# Patient Record
Sex: Female | Born: 1945
Health system: Southern US, Community
[De-identification: ages and names within clinical notes are randomized; demographics above are authoritative.]

## PROBLEM LIST (undated history)

## (undated) DIAGNOSIS — T7840XA Allergy, unspecified, initial encounter: Secondary | ICD-10-CM

## (undated) DIAGNOSIS — M545 Low back pain, unspecified: Secondary | ICD-10-CM

## (undated) DIAGNOSIS — M1990 Unspecified osteoarthritis, unspecified site: Secondary | ICD-10-CM

## (undated) DIAGNOSIS — B029 Zoster without complications: Secondary | ICD-10-CM

## (undated) DIAGNOSIS — E119 Type 2 diabetes mellitus without complications: Secondary | ICD-10-CM

## (undated) DIAGNOSIS — F329 Major depressive disorder, single episode, unspecified: Secondary | ICD-10-CM

## (undated) DIAGNOSIS — F32A Depression, unspecified: Secondary | ICD-10-CM

## (undated) DIAGNOSIS — I10 Essential (primary) hypertension: Secondary | ICD-10-CM

## (undated) HISTORY — DX: Allergy, unspecified, initial encounter: T78.40XA

## (undated) HISTORY — PX: JOINT REPLACEMENT: SHX530

## (undated) HISTORY — DX: Low back pain, unspecified: M54.50

## (undated) HISTORY — DX: Depression, unspecified: F32.A

## (undated) HISTORY — DX: Low back pain: M54.5

## (undated) HISTORY — DX: Zoster without complications: B02.9

## (undated) HISTORY — DX: Major depressive disorder, single episode, unspecified: F32.9

---

## 2011-04-20 DIAGNOSIS — E119 Type 2 diabetes mellitus without complications: Secondary | ICD-10-CM | POA: Insufficient documentation

## 2011-04-20 DIAGNOSIS — E669 Obesity, unspecified: Secondary | ICD-10-CM | POA: Insufficient documentation

## 2013-11-06 DIAGNOSIS — M8580 Other specified disorders of bone density and structure, unspecified site: Secondary | ICD-10-CM | POA: Insufficient documentation

## 2014-01-28 DIAGNOSIS — G8929 Other chronic pain: Secondary | ICD-10-CM | POA: Insufficient documentation

## 2014-01-28 DIAGNOSIS — M545 Low back pain, unspecified: Secondary | ICD-10-CM | POA: Insufficient documentation

## 2014-03-05 DIAGNOSIS — K760 Fatty (change of) liver, not elsewhere classified: Secondary | ICD-10-CM | POA: Insufficient documentation

## 2014-04-09 ENCOUNTER — Ambulatory Visit: Payer: Self-pay | Admitting: Internal Medicine

## 2014-05-04 ENCOUNTER — Ambulatory Visit: Payer: Self-pay | Admitting: Internal Medicine

## 2014-05-30 ENCOUNTER — Ambulatory Visit: Payer: Self-pay | Admitting: Internal Medicine

## 2014-06-21 ENCOUNTER — Ambulatory Visit: Payer: Self-pay | Admitting: Internal Medicine

## 2015-03-30 ENCOUNTER — Emergency Department
Admission: EM | Admit: 2015-03-30 | Discharge: 2015-03-30 | Disposition: A | Discharge status: Home / Self Care | Payer: Medicare Other | Attending: Emergency Medicine | Admitting: Emergency Medicine

## 2015-03-30 ENCOUNTER — Encounter: Payer: Self-pay | Admitting: Medical Oncology

## 2015-03-30 DIAGNOSIS — Y838 Other surgical procedures as the cause of abnormal reaction of the patient, or of later complication, without mention of misadventure at the time of the procedure: Secondary | ICD-10-CM | POA: Diagnosis not present

## 2015-03-30 DIAGNOSIS — I10 Essential (primary) hypertension: Secondary | ICD-10-CM | POA: Diagnosis not present

## 2015-03-30 DIAGNOSIS — L03116 Cellulitis of left lower limb: Secondary | ICD-10-CM | POA: Diagnosis not present

## 2015-03-30 DIAGNOSIS — E119 Type 2 diabetes mellitus without complications: Secondary | ICD-10-CM | POA: Diagnosis not present

## 2015-03-30 DIAGNOSIS — T814XXA Infection following a procedure, initial encounter: Secondary | ICD-10-CM | POA: Diagnosis present

## 2015-03-30 DIAGNOSIS — Z9889 Other specified postprocedural states: Secondary | ICD-10-CM | POA: Diagnosis not present

## 2015-03-30 HISTORY — DX: Essential (primary) hypertension: I10

## 2015-03-30 HISTORY — DX: Unspecified osteoarthritis, unspecified site: M19.90

## 2015-03-30 HISTORY — DX: Type 2 diabetes mellitus without complications: E11.9

## 2015-03-30 LAB — BASIC METABOLIC PANEL
Anion gap: 11 (ref 5–15)
BUN: 13 mg/dL (ref 6–20)
CO2: 32 mmol/L (ref 22–32)
Calcium: 9 mg/dL (ref 8.9–10.3)
Chloride: 96 mmol/L — ABNORMAL LOW (ref 101–111)
Creatinine, Ser: 0.85 mg/dL (ref 0.44–1.00)
GFR calc non Af Amer: >60 mL/min (ref >60)
Glucose, Bld: 118 mg/dL — ABNORMAL HIGH (ref 65–99)
Potassium: 3.1 mmol/L — ABNORMAL LOW (ref 3.5–5.1)
Sodium: 139 mmol/L (ref 135–145)

## 2015-03-30 LAB — CBC WITH DIFFERENTIAL/PLATELET
Basophils Absolute: 0.1 10*3/uL (ref 0–0.1)
Basophils Relative: 1 %
EOS ABS: 0.6 10*3/uL (ref 0–0.7)
Eosinophils Relative: 7 %
HCT: 33 % — ABNORMAL LOW (ref 35.0–47.0)
HEMOGLOBIN: 11.3 g/dL — AB (ref 12.0–16.0)
LYMPHS PCT: 16 %
Lymphs Abs: 1.4 10*3/uL (ref 1.0–3.6)
MCH: 29.7 pg (ref 26.0–34.0)
MCHC: 34.2 g/dL (ref 32.0–36.0)
MCV: 86.7 fL (ref 80.0–100.0)
MONO ABS: 0.9 10*3/uL (ref 0.2–0.9)
Monocytes Relative: 10 %
Neutro Abs: 5.6 10*3/uL (ref 1.4–6.5)
Neutrophils Relative %: 66 %
Platelets: 261 10*3/uL (ref 150–440)
RBC: 3.8 MIL/uL (ref 3.80–5.20)
RDW: 13.9 % (ref 11.5–14.5)
WBC: 8.5 10*3/uL (ref 3.6–11.0)

## 2015-03-30 LAB — LACTIC ACID, PLASMA: LACTIC ACID, VENOUS: 1.6 mmol/L (ref 0.5–2.0)

## 2015-03-30 MED ORDER — CEPHALEXIN 500 MG PO CAPS
500.0000 mg | ORAL_CAPSULE | Freq: Once | ORAL | Status: AC
  Administered 2015-03-30: 500 mg via ORAL

## 2015-03-30 MED ORDER — CEPHALEXIN 500 MG PO CAPS: 500.0000 mg | ORAL_CAPSULE | Freq: Four times a day (QID) | ORAL | Status: AC

## 2015-03-30 MED ORDER — CEPHALEXIN 500 MG PO CAPS
ORAL_CAPSULE | ORAL | Status: AC
  Administered 2015-03-30: 500 mg via ORAL
  Filled 2015-03-30: qty 1

## 2015-03-30 NOTE — ED Notes (Signed)
Wound cleaned and dressed.

## 2015-03-30 NOTE — ED Notes (Signed)
Pt alert and in NAD at time of d/c to husband. 

## 2015-03-30 NOTE — Discharge Instructions (Signed)
Please seek medical attention for any high fevers, chest pain, shortness of breath, change in behavior, persistent vomiting, bloody stool or any other new or concerning symptoms. ° °Cellulitis °Cellulitis is an infection of the skin and the tissue beneath it. The infected area is usually red and tender. Cellulitis occurs most often in the arms and lower legs.  °CAUSES  °Cellulitis is caused by bacteria that enter the skin through cracks or cuts in the skin. The most common types of bacteria that cause cellulitis are staphylococci and streptococci. °SIGNS AND SYMPTOMS  °· Redness and warmth. °· Swelling. °· Tenderness or pain. °· Fever. °DIAGNOSIS  °Your health care provider can usually determine what is wrong based on a physical exam. Blood tests may also be done. °TREATMENT  °Treatment usually involves taking an antibiotic medicine. °HOME CARE INSTRUCTIONS  °· Take your antibiotic medicine as directed by your health care provider. Finish the antibiotic even if you start to feel better. °· Keep the infected arm or leg elevated to reduce swelling. °· Apply a warm cloth to the affected area up to 4 times per day to relieve pain. °· Take medicines only as directed by your health care provider. °· Keep all follow-up visits as directed by your health care provider. °SEEK MEDICAL CARE IF:  °· You notice red streaks coming from the infected area. °· Your red area gets larger or turns dark in color. °· Your bone or joint underneath the infected area becomes painful after the skin has healed. °· Your infection returns in the same area or another area. °· You notice a swollen bump in the infected area. °· You develop new symptoms. °· You have a fever. °SEEK IMMEDIATE MEDICAL CARE IF:  °· You feel very sleepy. °· You develop vomiting or diarrhea. °· You have a general ill feeling (malaise) with muscle aches and pains. °MAKE SURE YOU:  °· Understand these instructions. °· Will watch your condition. °· Will get help right away  if you are not doing well or get worse. °Document Released: 06/30/2005 Document Revised: 02/04/2014 Document Reviewed: 12/06/2011 °ExitCare® Patient Information ©2015 [employer]. This information is not intended to replace advice given to you by your health care provider. Make sure you discuss any questions you have with your health care provider. ° °

## 2015-03-30 NOTE — ED Provider Notes (Signed)
[facility] Emergency Department Provider Note   ____________________________________________  Time seen: 58  I have reviewed the triage vital signs and the nursing notes.   HISTORY  Chief Complaint Post-op Problem   History limited by: Not Limited   HPI Jennifer Tanner is a 69 y.o. female presents to the emergency department with concerns for left hip and discharge. The patient underwent total left [facility] [place]. She states that she has been doing her exercises at home. Noticed that the bandage became saturated last night. Additionally the patient has noticed increased swelling of her leg with some slight erythema. She states her pain has not changed significantly. She denies any fevers.   Past Medical History  Diagnosis Date  . Arthritis   . Diabetes mellitus without complication   . Hypertension     There are no active problems to display for this patient.   Past Surgical History  Procedure Laterality Date  . Joint replacement      left hip    No current outpatient prescriptions on file.  Allergies Sulfa antibiotics  No family history on file.  Social History History  Substance Use Topics  . Smoking status: Never Smoker   . Smokeless tobacco: Not on file  . Alcohol Use: No    Review of Systems  Constitutional: Negative for fever. Cardiovascular: Negative for chest pain. Respiratory: Negative for shortness of breath. Gastrointestinal: Negative for abdominal pain, vomiting and diarrhea. Genitourinary: Negative for dysuria. Musculoskeletal: Negative for back pain. Left hip and upper leg swelling. Skin: Redness to left hip. Neurological: Negative for headaches, focal weakness or numbness.   10-point ROS otherwise negative.  ____________________________________________   PHYSICAL EXAM:  VITAL SIGNS: ED Triage Vitals  Enc Vitals Group     BP 03/30/15 1825 130/53 mmHg     Pulse Rate 03/30/15 1825 102     Resp 03/30/15 1825 22     Temp 03/30/15 1825 98.3 F (36.8 C)     Temp Source 03/30/15 1825 Jennifer     SpO2 03/30/15 1825 97 %     Weight 03/30/15 1825 221 lb (100.245 kg)     Height 03/30/15 1825 5\' 1"  (1.549 m)   Constitutional: Alert and oriented. Well appearing and in no distress. Eyes: Conjunctivae are normal. PERRL. Normal extraocular movements. ENT   Head: Normocephalic and atraumatic.   Nose: No congestion/rhinnorhea.   Mouth/Throat: Mucous membranes are moist.   Neck: No stridor. Hematological/Lymphatic/Immunilogical: No cervical lymphadenopathy. Cardiovascular: Normal rate, regular rhythm.  No murmurs, rubs, or gallops. Respiratory: Normal respiratory effort without tachypnea nor retractions. Breath sounds are clear and equal bilaterally. No wheezes/rales/rhonchi. Gastrointestinal: Soft and nontender. No distention. There is no CVA tenderness. Genitourinary: Deferred Musculoskeletal: Normal range of motion in all extremities. No joint effusions.  No lower extremity tenderness nor edema. Neurologic:  Normal speech and language. No gross focal neurologic deficits are appreciated. Speech is normal.  Skin: Incision site to left lateral thigh. Incision is clean and intact. No obvious dehiscence. There is some surrounding erythema and warmth to the wound. Psychiatric: Mood and affect are normal. Speech and behavior are normal. Patient exhibits appropriate insight and judgment.  ____________________________________________    LABS (pertinent positives/negatives)  Labs Reviewed  CBC WITH DIFFERENTIAL/PLATELET - Abnormal; Notable for the following:    Hemoglobin 11.3 (*)    HCT 33.0 (*)    All other components within normal limits  BASIC METABOLIC PANEL - Abnormal; Notable for  the following:    Potassium 3.1 (*)    Chloride 96 (*)    Glucose, Bld 118 (*)    All other components within normal limits  LACTIC ACID, PLASMA  LACTIC  ACID, PLASMA     ____________________________________________   EKG  None  ____________________________________________    RADIOLOGY  None  ____________________________________________   PROCEDURES  Procedure(s) performed: None  Critical Care performed: No  ____________________________________________   INITIAL IMPRESSION / ASSESSMENT AND PLAN / ED COURSE  Pertinent labs & imaging results that were available during my care of the patient were reviewed by me and considered in my medical decision making (see chart for details).  Patient presents because of concerns for wound swelling, possible infection. On exam the wound itself does not appear to be grossly infection however there is some surrounding erythema to the skin. Blood work was checked and no leukocytosis or lactic acidosis was noted. Additionally patient afebrile. At this point I think a deep infection unlikely. I will plan on treating for cellulitis and advised patient to contact her surgeon in the morning for evaluation.  ____________________________________________   FINAL CLINICAL IMPRESSION(S) / ED DIAGNOSES  Final diagnoses:  Cellulitis of left lower extremity     Phineas Semen, MD 03/30/15 2322

## 2015-03-30 NOTE — ED Notes (Signed)
Pt reports that she had left hip total replacement Wednesday, today she noticed that there was more drainage from site than it was in previous days. Also reports that her upper leg feels tight and swollen.

## 2015-03-30 NOTE — ED Notes (Signed)
measurements taken of each thigh and mid shin lower leg. Rt shin circumference is 29 cm, rt thigh circumference at 60cm, left shin at 42.5 cm circumference and left thigh circumference at 62.5 cm

## 2015-10-01 ENCOUNTER — Encounter: Payer: Self-pay | Admitting: Internal Medicine

## 2016-02-19 DIAGNOSIS — I10 Essential (primary) hypertension: Secondary | ICD-10-CM | POA: Insufficient documentation

## 2016-02-19 DIAGNOSIS — K219 Gastro-esophageal reflux disease without esophagitis: Secondary | ICD-10-CM | POA: Insufficient documentation

## 2016-02-26 DIAGNOSIS — M79605 Pain in left leg: Secondary | ICD-10-CM | POA: Insufficient documentation

## 2016-07-06 ENCOUNTER — Ambulatory Visit (INDEPENDENT_AMBULATORY_CARE_PROVIDER_SITE_OTHER): Payer: Medicare Other | Admitting: Podiatry

## 2016-07-06 VITALS — BP 147/88 | HR 74 | Resp 18

## 2016-07-06 DIAGNOSIS — M79609 Pain in unspecified limb: Principal | ICD-10-CM

## 2016-07-06 DIAGNOSIS — E0843 Diabetes mellitus due to underlying condition with diabetic autonomic (poly)neuropathy: Secondary | ICD-10-CM | POA: Diagnosis not present

## 2016-07-06 DIAGNOSIS — L608 Other nail disorders: Secondary | ICD-10-CM

## 2016-07-06 DIAGNOSIS — B351 Tinea unguium: Secondary | ICD-10-CM | POA: Diagnosis not present

## 2016-07-06 DIAGNOSIS — L603 Nail dystrophy: Secondary | ICD-10-CM

## 2016-07-06 DIAGNOSIS — M79676 Pain in unspecified toe(s): Secondary | ICD-10-CM

## 2016-07-06 NOTE — Patient Instructions (Signed)
Diabetes and Foot Care Diabetes may cause you to have problems because of poor blood supply (circulation) to your feet and legs. This may cause the skin on your feet to become thinner, break easier, and heal more slowly. Your skin may become dry, and the skin may peel and crack. You may also have nerve damage in your legs and feet causing decreased feeling in them. You may not notice minor injuries to your feet that could lead to infections or more serious problems. Taking care of your feet is one of the most important things you can do for yourself.  HOME CARE INSTRUCTIONS  Wear shoes at all times, even in the house. Do not go barefoot. Bare feet are easily injured.  Check your feet daily for blisters, cuts, and redness. If you cannot see the bottom of your feet, use a mirror or ask someone for help.  Wash your feet with warm water (do not use hot water) and mild soap. Then pat your feet and the areas between your toes until they are completely dry. Do not soak your feet as this can dry your skin.  Apply a moisturizing lotion or petroleum jelly (that does not contain alcohol and is unscented) to the skin on your feet and to dry, brittle toenails. Do not apply lotion between your toes.  Trim your toenails straight across. Do not dig under them or around the cuticle. File the edges of your nails with an emery board or nail file.  Do not cut corns or calluses or try to remove them with medicine.  Wear clean socks or stockings every day. Make sure they are not too tight. Do not wear knee-high stockings since they may decrease blood flow to your legs.  Wear shoes that fit properly and have enough cushioning. To break in new shoes, wear them for just a few hours a day. This prevents you from injuring your feet. Always look in your shoes before you put them on to be sure there are no objects inside.  Do not cross your legs. This may decrease the blood flow to your feet.  If you find a minor scrape,  cut, or break in the skin on your feet, keep it and the skin around it clean and dry. These areas may be cleansed with mild soap and water. Do not cleanse the area with peroxide, alcohol, or iodine.  When you remove an adhesive bandage, be sure not to damage the skin around it.  If you have a wound, look at it several times a day to make sure it is healing.  Do not use heating pads or hot water bottles. They may burn your skin. If you have lost feeling in your feet or legs, you may not know it is happening until it is too late.  Make sure your health care provider performs a complete foot exam at least annually or more often if you have foot problems. Report any cuts, sores, or bruises to your health care provider immediately. SEEK MEDICAL CARE IF:   You have an injury that is not healing.  You have cuts or breaks in the skin.  You have an ingrown nail.  You notice redness on your legs or feet.  You feel burning or tingling in your legs or feet.  You have pain or cramps in your legs and feet.  Your legs or feet are numb.  Your feet always feel cold. SEEK IMMEDIATE MEDICAL CARE IF:   There is increasing redness,   swelling, or pain in or around a wound.  There is a red line that goes up your leg.  Pus is coming from a wound.  You develop a fever or as directed by your health care provider.  You notice a bad smell coming from an ulcer or wound.   This information is not intended to replace advice given to you by your health care provider. Make sure you discuss any questions you have with your health care provider.   Document Released: 09/17/2000 Document Revised: 05/23/2013 Document Reviewed: 02/27/2013 Elsevier Interactive Patient Education 2016 [employer]  

## 2016-07-11 NOTE — Progress Notes (Signed)
SUBJECTIVE Patient with a history of diabetes mellitus presents to office today complaining of elongated, thickened nails. Pain while ambulating in shoes. Patient is unable to trim their own nails.   Allergies  Allergen Reactions  . Sulfa Antibiotics Hives    OBJECTIVE General Patient is awake, alert, and oriented x 3 and in no acute distress. Derm Skin is dry and supple bilateral. Negative open lesions or macerations. Remaining integument unremarkable. Nails are tender, long, thickened and dystrophic with subungual debris, consistent with onychomycosis, 1-5 bilateral. No signs of infection noted. Vasc  DP and PT pedal pulses palpable bilaterally. Temperature gradient within normal limits.  Neuro Epicritic and protective threshold sensation diminished bilaterally.  Musculoskeletal Exam No symptomatic pedal deformities noted bilateral. Muscular strength within normal limits.  ASSESSMENT 1. Diabetes Mellitus w/ peripheral neuropathy 2. Onychomycosis of nail due to dermatophyte bilateral 3. Pain in foot bilateral  PLAN OF CARE 1. Patient evaluated today. 2. Instructed to maintain good pedal hygiene and foot care. Stressed importance of controlling blood sugar.  3. Mechanical debridement of nails 1-5 bilaterally performed using a nail nipper. Filed with dremel without incident.  4. Today prescription for antifungal nail lacquer was dispensed through [facility]  5. Return to clinic in 3 mos.    Felecia Shelling, DPM

## 2016-07-22 ENCOUNTER — Encounter: Payer: Self-pay | Admitting: Internal Medicine

## 2016-09-07 LAB — HM DEXA SCAN: HM DEXA SCAN: ABNORMAL

## 2016-10-07 DIAGNOSIS — I10 Essential (primary) hypertension: Secondary | ICD-10-CM | POA: Diagnosis not present

## 2016-10-07 DIAGNOSIS — E785 Hyperlipidemia, unspecified: Secondary | ICD-10-CM | POA: Diagnosis not present

## 2016-10-07 DIAGNOSIS — E119 Type 2 diabetes mellitus without complications: Secondary | ICD-10-CM | POA: Diagnosis not present

## 2016-12-15 DIAGNOSIS — E669 Obesity, unspecified: Secondary | ICD-10-CM | POA: Diagnosis not present

## 2016-12-15 DIAGNOSIS — J329 Chronic sinusitis, unspecified: Secondary | ICD-10-CM | POA: Diagnosis not present

## 2016-12-15 DIAGNOSIS — E119 Type 2 diabetes mellitus without complications: Secondary | ICD-10-CM | POA: Diagnosis not present

## 2017-01-03 ENCOUNTER — Ambulatory Visit (INDEPENDENT_AMBULATORY_CARE_PROVIDER_SITE_OTHER): Payer: Medicare Other | Admitting: Podiatry

## 2017-01-03 ENCOUNTER — Encounter: Payer: Self-pay | Admitting: Podiatry

## 2017-01-03 DIAGNOSIS — Q828 Other specified congenital malformations of skin: Secondary | ICD-10-CM

## 2017-01-03 NOTE — Progress Notes (Signed)
This patient presents to the office with chief complaint of a painful callus on the outside ball of her left foot. She says that the pain has been present for approximately 2-1/2 weeks and is increasing in severity. Patient is diabetic and had her nails worked on in October 2017. She presents the office today for an evaluation and treatment of the painful callus on the bottom of her left foot.  Patient is diabetic.  GENERAL APPEARANCE: Alert, conversant. Appropriately groomed. No acute distress.  VASCULAR: Pedal pulses are  palpable at  [facility] and PT bilateral.  Capillary refill time is immediate to all digits,  Normal temperature gradient.  Digital hair growth is present bilateral  NEUROLOGIC: sensation is diminished  to 5.07 monofilament at 5/5 sites bilateral.  Light touch is intact bilateral, Muscle strength normal.  MUSCULOSKELETAL: acceptable muscle strength, tone and stability bilateral.  Intrinsic muscluature intact bilateral.  Rectus appearance of foot and digits noted bilateral.   DERMATOLOGIC: skin color, texture, and turgor are within normal limits.  No preulcerative lesions or ulcers  are seen, no interdigital maceration noted.  No open lesions present.  Digital nails are asymptomatic. No drainage noted.Porokeratosis sub 5th met left foot.  Porokeratosis  Left foot  ROV  Debridement of porokeratosis left foot. RTC prn   Gardiner Barefoot DPM

## 2017-01-06 DIAGNOSIS — E119 Type 2 diabetes mellitus without complications: Secondary | ICD-10-CM | POA: Diagnosis not present

## 2017-01-06 DIAGNOSIS — E785 Hyperlipidemia, unspecified: Secondary | ICD-10-CM | POA: Diagnosis not present

## 2017-01-06 DIAGNOSIS — I10 Essential (primary) hypertension: Secondary | ICD-10-CM | POA: Diagnosis not present

## 2017-01-10 ENCOUNTER — Encounter: Payer: Self-pay | Admitting: Internal Medicine

## 2017-01-10 DIAGNOSIS — E785 Hyperlipidemia, unspecified: Secondary | ICD-10-CM | POA: Diagnosis not present

## 2017-01-10 DIAGNOSIS — E119 Type 2 diabetes mellitus without complications: Secondary | ICD-10-CM | POA: Diagnosis not present

## 2017-01-10 DIAGNOSIS — I10 Essential (primary) hypertension: Secondary | ICD-10-CM | POA: Diagnosis not present

## 2017-01-10 DIAGNOSIS — E7800 Pure hypercholesterolemia, unspecified: Secondary | ICD-10-CM | POA: Diagnosis not present

## 2017-01-10 DIAGNOSIS — E669 Obesity, unspecified: Secondary | ICD-10-CM | POA: Diagnosis not present

## 2017-02-14 ENCOUNTER — Ambulatory Visit (INDEPENDENT_AMBULATORY_CARE_PROVIDER_SITE_OTHER): Payer: Medicare Other | Admitting: Podiatry

## 2017-02-14 DIAGNOSIS — M79609 Pain in unspecified limb: Secondary | ICD-10-CM | POA: Diagnosis not present

## 2017-02-14 DIAGNOSIS — M79674 Pain in right toe(s): Secondary | ICD-10-CM | POA: Diagnosis not present

## 2017-02-14 DIAGNOSIS — Q828 Other specified congenital malformations of skin: Secondary | ICD-10-CM | POA: Diagnosis not present

## 2017-02-14 DIAGNOSIS — E119 Type 2 diabetes mellitus without complications: Secondary | ICD-10-CM | POA: Diagnosis not present

## 2017-02-14 DIAGNOSIS — M79675 Pain in left toe(s): Secondary | ICD-10-CM | POA: Diagnosis not present

## 2017-02-14 DIAGNOSIS — B351 Tinea unguium: Secondary | ICD-10-CM | POA: Diagnosis not present

## 2017-02-14 DIAGNOSIS — M79676 Pain in unspecified toe(s): Secondary | ICD-10-CM | POA: Diagnosis not present

## 2017-02-14 NOTE — Progress Notes (Signed)
Complaint:  Visit Type: Patient returns to my office for continued preventative foot care services. Complaint: Patient states" my nails have grown long and thick and become painful to walk and wear shoes" Patient has been diagnosed with DM with no foot complications. The patient presents for preventative foot care services. No changes to ROS.  She has return of painful porokeratosis  Left foot.  Podiatric Exam: Vascular: dorsalis pedis and posterior tibial pulses are palpable bilateral. Capillary return is immediate. Temperature gradient is WNL. Skin turgor WNL  Sensorium: Normal Semmes Weinstein monofilament test. Normal tactile sensation bilaterally. Nail Exam: Pt has thick disfigured discolored nails with subungual debris noted bilateral entire nail hallux through fifth toenails Ulcer Exam: There is no evidence of ulcer or pre-ulcerative changes or infection. Orthopedic Exam: Muscle tone and strength are WNL. No limitations in general ROM. No crepitus or effusions noted. HAV  B/L. Skin: No Porokeratosis. No infection or ulcers  Diagnosis:  Onychomycosis, , Pain in right toe, pain in left toes,  Porokeratosis  Treatment & Plan Procedures and Treatment: Consent by patient was obtained for treatment procedures. The patient understood the discussion of treatment and procedures well. All questions were answered thoroughly reviewed. Debridement of mycotic and hypertrophic toenails, 1 through 5 bilateral and clearing of subungual debris. No ulceration, no infection noted. Debride porokeratosis left foot..  Patient was sensitive so used dremel tool for thick nails.   Return Visit-Office Procedure: Patient instructed to return to the office for a follow up visit 10 weeks  for continued evaluation and treatment.    Helane Gunther DPM

## 2017-03-08 DIAGNOSIS — Z96642 Presence of left artificial hip joint: Secondary | ICD-10-CM | POA: Diagnosis not present

## 2017-03-08 DIAGNOSIS — M1711 Unilateral primary osteoarthritis, right knee: Secondary | ICD-10-CM | POA: Diagnosis not present

## 2017-04-07 DIAGNOSIS — E668 Other obesity: Secondary | ICD-10-CM | POA: Diagnosis not present

## 2017-04-07 DIAGNOSIS — E782 Mixed hyperlipidemia: Secondary | ICD-10-CM | POA: Diagnosis not present

## 2017-04-07 DIAGNOSIS — E119 Type 2 diabetes mellitus without complications: Secondary | ICD-10-CM | POA: Diagnosis not present

## 2017-04-07 DIAGNOSIS — I10 Essential (primary) hypertension: Secondary | ICD-10-CM | POA: Diagnosis not present

## 2017-04-13 DIAGNOSIS — E669 Obesity, unspecified: Secondary | ICD-10-CM | POA: Diagnosis not present

## 2017-04-13 DIAGNOSIS — E119 Type 2 diabetes mellitus without complications: Secondary | ICD-10-CM | POA: Diagnosis not present

## 2017-04-13 DIAGNOSIS — I10 Essential (primary) hypertension: Secondary | ICD-10-CM | POA: Diagnosis not present

## 2017-04-13 DIAGNOSIS — E785 Hyperlipidemia, unspecified: Secondary | ICD-10-CM | POA: Diagnosis not present

## 2017-04-28 ENCOUNTER — Ambulatory Visit: Payer: Medicare Other | Admitting: Podiatry

## 2017-05-09 DIAGNOSIS — I10 Essential (primary) hypertension: Secondary | ICD-10-CM | POA: Diagnosis not present

## 2017-05-09 DIAGNOSIS — E669 Obesity, unspecified: Secondary | ICD-10-CM | POA: Diagnosis not present

## 2017-05-09 DIAGNOSIS — E119 Type 2 diabetes mellitus without complications: Secondary | ICD-10-CM | POA: Diagnosis not present

## 2017-05-09 DIAGNOSIS — E785 Hyperlipidemia, unspecified: Secondary | ICD-10-CM | POA: Diagnosis not present

## 2017-05-30 ENCOUNTER — Ambulatory Visit (INDEPENDENT_AMBULATORY_CARE_PROVIDER_SITE_OTHER): Payer: Medicare Other | Admitting: Podiatry

## 2017-05-30 DIAGNOSIS — Z9889 Other specified postprocedural states: Secondary | ICD-10-CM | POA: Insufficient documentation

## 2017-05-30 DIAGNOSIS — E119 Type 2 diabetes mellitus without complications: Secondary | ICD-10-CM | POA: Diagnosis not present

## 2017-05-30 DIAGNOSIS — M51369 Other intervertebral disc degeneration, lumbar region without mention of lumbar back pain or lower extremity pain: Secondary | ICD-10-CM | POA: Insufficient documentation

## 2017-05-30 DIAGNOSIS — I10 Essential (primary) hypertension: Secondary | ICD-10-CM | POA: Insufficient documentation

## 2017-05-30 DIAGNOSIS — M7752 Other enthesopathy of left foot: Secondary | ICD-10-CM

## 2017-05-30 DIAGNOSIS — M722 Plantar fascial fibromatosis: Secondary | ICD-10-CM

## 2017-05-30 DIAGNOSIS — M779 Enthesopathy, unspecified: Secondary | ICD-10-CM | POA: Diagnosis not present

## 2017-05-30 DIAGNOSIS — M7070 Other bursitis of hip, unspecified hip: Secondary | ICD-10-CM | POA: Insufficient documentation

## 2017-05-30 DIAGNOSIS — IMO0002 Reserved for concepts with insufficient information to code with codable children: Secondary | ICD-10-CM | POA: Insufficient documentation

## 2017-05-30 DIAGNOSIS — Z8719 Personal history of other diseases of the digestive system: Secondary | ICD-10-CM | POA: Insufficient documentation

## 2017-05-30 DIAGNOSIS — M1990 Unspecified osteoarthritis, unspecified site: Secondary | ICD-10-CM | POA: Insufficient documentation

## 2017-05-30 DIAGNOSIS — M7550 Bursitis of unspecified shoulder: Secondary | ICD-10-CM | POA: Insufficient documentation

## 2017-05-30 DIAGNOSIS — M5136 Other intervertebral disc degeneration, lumbar region: Secondary | ICD-10-CM | POA: Insufficient documentation

## 2017-05-30 DIAGNOSIS — K219 Gastro-esophageal reflux disease without esophagitis: Secondary | ICD-10-CM | POA: Insufficient documentation

## 2017-05-30 DIAGNOSIS — M1610 Unilateral primary osteoarthritis, unspecified hip: Secondary | ICD-10-CM | POA: Insufficient documentation

## 2017-05-30 MED ORDER — MELOXICAM 15 MG PO TABS: 15.0000 mg | ORAL_TABLET | Freq: Every day | ORAL | 3 refills | Status: DC

## 2017-05-30 NOTE — Addendum Note (Signed)
Addended by: Geraldine Contras D on: 05/30/2017 02:47 PM   Modules accepted: Orders

## 2017-05-30 NOTE — Progress Notes (Signed)
Complaint:  Visit Type: Patient returns to my office for an evaluation of her left painful heel.  She says she has developed pain in the back of her left heel for approximately 3 weeks.  She says that the pain is not present when she walks and when she stands.  She says that she experiences more pain when she sleeps at night at the site of her left heel.  She has no history of trauma or injury to the foot.  She presents the office for an evaluation of this condition.  Podiatric Exam: Vascular: dorsalis pedis and posterior tibial pulses are palpable bilateral. Capillary return is immediate. Temperature gradient is WNL. Skin turgor WNL  Sensorium: Normal Semmes Weinstein monofilament test. Normal tactile sensation bilaterally. Nail Exam: Pt has thick disfigured discolored nails with subungual debris noted bilateral entire nail hallux through fifth toenails Ulcer Exam: There is no evidence of ulcer or pre-ulcerative changes or infection. Orthopedic Exam: Muscle tone and strength are WNL. No limitations in general ROM. No crepitus or effusions noted. HAV  B/L. pain along the lateral aspect of the lateral heel bone at the site of the attachment of the Achilles tendon.  Pain is elicited upon forced plantarflexion of the left foot Skin: No Porokeratosis. No infection or ulcers  Diagnosis:  Retrocalcaneal bursitis left foot.  Achilles tendon left foot.  Treatment & Plan Procedures and Treatment:  ROV  discussed this condition with this patient.   There is no evidence of any redness, swelling and pain is only during sleep.  We decided to treat her with anti-inflammatory medication for 4 weeks and then to reevaluate in the office as needed.  Prescribed Mobic to be taken by mouth Return Visit-Office Procedure: Patient instructed to return to the office for a follow up visit 4 weeks  for continued evaluation and treatment.    Helane Gunther DPM

## 2017-05-30 NOTE — Addendum Note (Signed)
Addended by: Geraldine Contras D on: 05/30/2017 02:08 PM   Modules accepted: Orders

## 2017-06-15 DIAGNOSIS — H35033 Hypertensive retinopathy, bilateral: Secondary | ICD-10-CM | POA: Diagnosis not present

## 2017-06-15 DIAGNOSIS — E119 Type 2 diabetes mellitus without complications: Secondary | ICD-10-CM | POA: Diagnosis not present

## 2017-06-15 DIAGNOSIS — H35372 Puckering of macula, left eye: Secondary | ICD-10-CM | POA: Diagnosis not present

## 2017-06-15 DIAGNOSIS — I10 Essential (primary) hypertension: Secondary | ICD-10-CM | POA: Diagnosis not present

## 2017-07-11 DIAGNOSIS — I10 Essential (primary) hypertension: Secondary | ICD-10-CM | POA: Diagnosis not present

## 2017-07-11 DIAGNOSIS — E119 Type 2 diabetes mellitus without complications: Secondary | ICD-10-CM | POA: Diagnosis not present

## 2017-07-11 DIAGNOSIS — E785 Hyperlipidemia, unspecified: Secondary | ICD-10-CM | POA: Diagnosis not present

## 2017-07-12 ENCOUNTER — Other Ambulatory Visit: Payer: Self-pay | Admitting: Internal Medicine

## 2017-07-12 DIAGNOSIS — Z1231 Encounter for screening mammogram for malignant neoplasm of breast: Secondary | ICD-10-CM

## 2017-07-28 ENCOUNTER — Ambulatory Visit
Admission: RE | Admit: 2017-07-28 | Discharge: 2017-07-28 | Disposition: A | Discharge status: Home / Self Care | Payer: Medicare Other | Source: Ambulatory Visit | Attending: Internal Medicine | Admitting: Internal Medicine

## 2017-07-28 DIAGNOSIS — Z1231 Encounter for screening mammogram for malignant neoplasm of breast: Secondary | ICD-10-CM | POA: Insufficient documentation

## 2017-08-08 ENCOUNTER — Other Ambulatory Visit: Payer: Self-pay | Admitting: *Deleted

## 2017-08-08 ENCOUNTER — Inpatient Hospital Stay
Admission: RE | Admit: 2017-08-08 | Discharge: 2017-08-08 | Disposition: A | Discharge status: Home / Self Care | Payer: Self-pay | Source: Ambulatory Visit | Attending: *Deleted | Admitting: *Deleted

## 2017-08-08 DIAGNOSIS — Z9289 Personal history of other medical treatment: Secondary | ICD-10-CM

## 2017-08-29 DIAGNOSIS — M545 Low back pain: Secondary | ICD-10-CM | POA: Diagnosis not present

## 2017-10-05 ENCOUNTER — Telehealth: Payer: Self-pay | Admitting: *Deleted

## 2017-10-05 NOTE — Telephone Encounter (Signed)
fyi

## 2017-10-05 NOTE — Telephone Encounter (Signed)
Copied from CRM [id number]. Topic: Appointment Scheduling - New Patient >> Oct 05, 2017  8:18 AM Oneal Grout wrote: New patient has been scheduled for your office. Provider: Dr French Ana Mclean-Scocuzza Date of Appointment: 11/03/17  Route to department's PEC pool.

## 2017-10-21 DIAGNOSIS — L309 Dermatitis, unspecified: Secondary | ICD-10-CM | POA: Diagnosis not present

## 2017-11-03 ENCOUNTER — Ambulatory Visit (INDEPENDENT_AMBULATORY_CARE_PROVIDER_SITE_OTHER): Payer: Medicare Other | Admitting: Internal Medicine

## 2017-11-03 ENCOUNTER — Encounter: Payer: Self-pay | Admitting: Internal Medicine

## 2017-11-03 VITALS — BP 132/70 | HR 74 | Temp 98.5°F | Ht 61.0 in | Wt 227.2 lb

## 2017-11-03 DIAGNOSIS — Z6841 Body Mass Index (BMI) 40.0 and over, adult: Secondary | ICD-10-CM

## 2017-11-03 DIAGNOSIS — M1990 Unspecified osteoarthritis, unspecified site: Secondary | ICD-10-CM

## 2017-11-03 DIAGNOSIS — I10 Essential (primary) hypertension: Secondary | ICD-10-CM

## 2017-11-03 DIAGNOSIS — M2550 Pain in unspecified joint: Secondary | ICD-10-CM

## 2017-11-03 DIAGNOSIS — E119 Type 2 diabetes mellitus without complications: Secondary | ICD-10-CM

## 2017-11-03 LAB — SEDIMENTATION RATE: Sed Rate: 25 mm/h (ref 0–30)

## 2017-11-03 LAB — C-REACTIVE PROTEIN: CRP: 0.4 mg/dL — ABNORMAL LOW (ref 0.5–20.0)

## 2017-11-03 NOTE — Patient Instructions (Signed)
Follow up in 2 months sooner if needed  Take care  Please do cologuard on Monday/Tuesday and return   Arthritis Arthritis is a term that is commonly used to refer to joint pain or joint disease. There are more than 100 types of arthritis. What are the causes? The most common cause of this condition is wear and tear of a joint. Other causes include:  Gout.  Inflammation of a joint.  An infection of a joint.  Sprains and other injuries near the joint.  A drug reaction or allergic reaction.  In some cases, the cause may not be known. What are the signs or symptoms? The main symptom of this condition is pain in the joint with movement. Other symptoms include:  Redness, swelling, or stiffness at a joint.  Warmth coming from the joint.  Fever.  Overall feeling of illness.  How is this diagnosed? This condition may be diagnosed with a physical exam and tests, including:  Blood tests.  Urine tests.  Imaging tests, such as MRI, X-rays, or a CT scan.  Sometimes, fluid is removed from a joint for testing. How is this treated? Treatment for this condition may involve:  Treatment of the cause, if it is known.  Rest.  Raising (elevating) the joint.  Applying cold or hot packs to the joint.  Medicines to improve symptoms and reduce inflammation.  Injections of a steroid such as cortisone into the joint to help reduce pain and inflammation.  Depending on the cause of your arthritis, you may need to make lifestyle changes to reduce stress on your joint. These changes may include exercising more and losing weight. Follow these instructions at home: Medicines  Take over-the-counter and prescription medicines only as told by your health care provider.  Do not take aspirin to relieve pain if gout is suspected. Activity  Rest your joint if told by your health care provider. Rest is important when your disease is active and your joint feels painful, swollen, or  stiff.  Avoid activities that make the pain worse. It is important to balance activity with rest.  Exercise your joint regularly with range-of-motion exercises as told by your health care provider. Try doing low-impact exercise, such as: ? Swimming. ? Water aerobics. ? Biking. ? Walking. Joint Care   If your joint is swollen, keep it elevated if told by your health care provider.  If your joint feels stiff in the morning, try taking a warm shower.  If directed, apply heat to the joint. If you have diabetes, do not apply heat without permission from your health care provider. ? Put a towel between the joint and the hot pack or heating pad. ? Leave the heat on the area for 20-30 minutes.  If directed, apply ice to the joint: ? Put ice in a plastic bag. ? Place a towel between your skin and the bag. ? Leave the ice on for 20 minutes, 2-3 times per day.  Keep all follow-up visits as told by your health care provider. This is important. Contact a health care provider if:  The pain gets worse.  You have a fever. Get help right away if:  You develop severe joint pain, swelling, or redness.  Many joints become painful and swollen.  You develop severe back pain.  You develop severe weakness in your leg.  You cannot control your bladder or bowels. This information is not intended to replace advice given to you by your health care provider. Make sure you discuss  any questions you have with your health care provider. Document Released: 10/28/2004 Document Revised: 02/26/2016 Document Reviewed: 12/16/2014 Elsevier Interactive Patient Education  [employer].

## 2017-11-03 NOTE — Progress Notes (Signed)
Pre visit review using our clinic review tool, if applicable. No additional management support is needed unless otherwise documented below in the visit note. 

## 2017-11-04 LAB — CYCLIC CITRUL PEPTIDE ANTIBODY, IGG

## 2017-11-04 LAB — ANA: Anti Nuclear Antibody(ANA): NEGATIVE

## 2017-11-04 LAB — RHEUMATOID FACTOR: Rhuematoid fact SerPl-aCnc: <14 IU/mL (ref <14)

## 2017-11-06 ENCOUNTER — Encounter: Payer: Self-pay | Admitting: Internal Medicine

## 2017-11-06 NOTE — Progress Notes (Addendum)
Chief Complaint  Patient presents with  . Establish Care   Establish care former [facility] pt [facility]. She c/o low back pain radiating down 1 leg and b/l hip pain s/p left hip surgery x 3. She last saw Dr. Joneen Caraway at [facility] in [place] and likely needs the right hip. She takes Mobic for pain which helps. Pain today 1/10 at times can be 10/10 worse with activities and temperature changes I.e cold weather. Denies injury. She had tried heat and therapeutic massage which help. She wants to know if this is osteoarthritis vs other arthritis today.   2. Obesity She she c/o not able to lose wt but she is not exercising 2/2 #1. She does try to watch what she eats. Exercise would make #1 worse and she is not interested in water aerobics.   3. HTN controlled norvasc 5, lisinopril 20 mg, chlorthalidone 25  4.DM 2 controlled Januvia 100 mg qd    Review of Systems  Constitutional: Negative for weight loss.  HENT: Negative for hearing loss.   Eyes:       No vision problems   Respiratory: Negative for shortness of breath.   Cardiovascular: Negative for chest pain.  Gastrointestinal: Negative for abdominal pain.  Musculoskeletal: Positive for back pain and joint pain. Negative for falls.  Skin: Negative for rash.  Neurological: Negative for dizziness and headaches.  Psychiatric/Behavioral: Negative for memory loss.   Past Medical History:  Diagnosis Date  . Allergy   . Arthritis    hips s/p left hip prosthesis x 3, low back sees [facility] Dr. Joneen Caraway [place]  . Diabetes mellitus without complication (HCC)   . Hypertension   . Low back pain    Past Surgical History:  Procedure Laterality Date  . JOINT REPLACEMENT     left hip x 3   Family History  Problem Relation Age of Onset  . Early death Mother   . Hypertension Mother   . Cancer Father        spine ?MM  . Heart disease Paternal Grandmother   . Tuberculosis Paternal Grandfather   . Arthritis Cousin     Social History   Socioeconomic History  . Marital status: Married    Spouse name: Not on file  . Number of children: Not on file  . Years of education: Not on file  . Highest education level: Not on file  Social Needs  . Financial resource strain: Not on file  . Food insecurity - worry: Not on file  . Food insecurity - inability: Not on file  . Transportation needs - medical: Not on file  . Transportation needs - non-medical: Not on file  Occupational History  . Not on file  Tobacco Use  . Smoking status: Never Smoker  . Smokeless tobacco: Never Used  Substance and Sexual Activity  . Alcohol use: No  . Drug use: No  . Sexual activity: No  Other Topics Concern  . Not on file  Social History Narrative   Retired    Actress performs Black history   married 2 kids    Current Meds  Medication Sig  . amLODipine (NORVASC) 5 MG tablet   . aspirin 325 MG tablet aspirin 325 mg tablet,delayed release  TAKE 1 TABLET BY MOUTH TWICE A DAY FOR 6 WEEKS AFTER SURGERY TO DECREASE RISK OF CLOTS  . chlorthalidone (HYGROTON) 25 MG tablet   . fluticasone (FLONASE) 50 MCG/ACT nasal spray Place into the nose.  Marland Kitchen  gabapentin (NEURONTIN) 100 MG capsule   . JANUVIA 100 MG tablet   . lisinopril (PRINIVIL,ZESTRIL) 20 MG tablet   . meloxicam (MOBIC) 15 MG tablet Take 1 tablet (15 mg total) by mouth daily.  . pantoprazole (PROTONIX) 40 MG tablet   . simvastatin (ZOCOR) 10 MG tablet    Allergies  Allergen Reactions  . Sulfa Antibiotics Hives and Dermatitis   Recent Results (from the past 2160 hour(s))  Rheumatoid Factor     Status: None   Collection Time: 11/03/17  9:52 AM  Result Value Ref Range   Rhuematoid fact SerPl-aCnc <14 <14 IU/mL  Antinuclear Antib (ANA)     Status: None   Collection Time: 11/03/17  9:52 AM  Result Value Ref Range   Anit Nuclear Antibody(ANA) NEGATIVE NEGATIVE    Comment: ANA IFA is a first line screen for detecting the presence of up to approximately 150  autoantibodies in various autoimmune diseases. A negative ANA IFA result suggests ANA-associated autoimmune diseases are not present at this time. . Visit Physician FAQs for interpretation of all antibodies in the [place], prevalence, and association with diseases at http://education.[facility].com/ [email or web address]/YNW295 .   Cyclic citrul peptide antibody, IgG     Status: None   Collection Time: 11/03/17  9:52 AM  Result Value Ref Range   Cyclic Citrullin Peptide Ab <62 UNITS    Comment: Reference Range Negative:            <20 Weak Positive:       20-39 Moderate Positive:   40-59 Strong Positive:     >59 .   C-reactive protein     Status: Abnormal   Collection Time: 11/03/17  9:52 AM  Result Value Ref Range   CRP 0.4 (L) 0.5 - 20.0 mg/dL  Sedimentation rate     Status: None   Collection Time: 11/03/17  9:55 AM  Result Value Ref Range   Sed Rate 25 0 - 30 mm/h   Objective  Body mass index is 42.93 kg/m. Wt Readings from Last 3 Encounters:  11/03/17 227 lb 3.2 oz (103.1 kg)  03/30/15 221 lb (100.2 kg)   Temp Readings from Last 3 Encounters:  11/03/17 98.5 F (36.9 C) (Oral)  03/30/15 98.3 F (36.8 C) (Oral)   BP Readings from Last 3 Encounters:  11/03/17 132/70  07/06/16 (!) 147/88  03/30/15 (!) 126/53   Pulse Readings from Last 3 Encounters:  11/03/17 74  07/06/16 74  03/30/15 87   O2 sat room air 99%   Physical Exam  Constitutional: She is oriented to person, place, and time and well-developed, well-nourished, and in no distress. Vital signs are normal.  HENT:  Head: Normocephalic and atraumatic.  Mouth/Throat: Oropharynx is clear and moist.  Eyes: Conjunctivae are normal. Pupils are equal, round, and reactive to light.  Cardiovascular: Normal rate, regular rhythm and normal heart sounds.  Pulmonary/Chest: Effort normal and breath sounds normal. She has no wheezes.  Abdominal: Soft. Bowel sounds are normal. There is no tenderness.  Musculoskeletal: She  exhibits no tenderness.       Right hip: She exhibits no tenderness.       Left hip: She exhibits no tenderness.       Lumbar back: She exhibits no tenderness.  Neurological: She is alert and oriented to person, place, and time. Gait normal.  Skin: Skin is warm, dry and intact.  Psychiatric: Mood, memory, affect and judgment normal.  Nursing note and vitals reviewed.   Assessment   1.  Arthralgia low back, hips, neck r/o other etiology I.e  Autoimmune/rheumatologic  2. Obesity BMI>42 her wt loss goal is 118 lbs  3. HTN controlled  4. DM 2 controlled  5. HM  Plan  1. RA w/u today  Prn Mobic  If needed will f/u [facility] Dr. Joneen Caraway  2.  In the past has been on Adipex P no longer on this medication. Unable to exercise 2/2 #1 trying to make healthy diet choices  3. Cont meds Norvasc 5, lisinopril 20, chlorthalidone 25  4. Cont Januvia 100 mg qd  Last eye summer 2018 my eye MD  Will do foot exam in future follows with podiatry Dr. Stacie Acres saw 05/30/17   Need to get labs from [facility] most recent 5. Had flu shot 07/10/17 Tdap 2018 per pt  pna shot 2017 ? Which one  Had shingrix 1/2 05/02/17 and 2/2 07/2017   Need to confirm all above vaccines from records from [facility] or local pharmacy [facility] now [facility] on [address]  Out of age window pap never had hysterectomy LMP 55  Colonoscopy had 5-6 years ago [place] but cant remember where will do cologuard disc'ed today and referral placed   DEXA will check [facility] records had 11/06/13 mild osteopenia spine nl hip-need to make sure taking vit D3 1000 IU and calcium 600 mg bid  Mammogram due 07/28/18 had 1 year ago this date and neg  Never smoker  Need to check records to see if tested hep B/C in past h/o fatty liver US 03/04/14 and needs hep A/B vaccine if has not had   Pending records [facility] DEXA , echo, images, notes, labs, vaccines Pending records [facility] xrays  Provider: Dr. French Ana McLean-Scoc[facility]

## 2017-11-14 DIAGNOSIS — Z1211 Encounter for screening for malignant neoplasm of colon: Secondary | ICD-10-CM | POA: Diagnosis not present

## 2017-11-14 DIAGNOSIS — Z1212 Encounter for screening for malignant neoplasm of rectum: Secondary | ICD-10-CM | POA: Diagnosis not present

## 2017-11-14 LAB — COLOGUARD: COLOGUARD: NEGATIVE

## 2017-11-24 ENCOUNTER — Other Ambulatory Visit: Payer: Self-pay | Admitting: Internal Medicine

## 2017-11-24 NOTE — Progress Notes (Signed)
cologaurd neg 11/14/17 thanks for doing this will repeat in 3 years.   TMS

## 2017-11-29 ENCOUNTER — Telehealth: Payer: Self-pay | Admitting: Internal Medicine

## 2017-11-29 ENCOUNTER — Telehealth: Payer: Self-pay

## 2017-11-29 NOTE — Telephone Encounter (Signed)
-----   Message from Bevelyn Buckles, MD sent at 11/24/2017  6:49 PM EST ----- cologaurd neg 11/14/17 thanks for doing this will repeat in 3 years.   TMS

## 2017-11-29 NOTE — Telephone Encounter (Signed)
Returned a call from the office regarding her Cologuard results.   I let her know it was negative.   She said I was the 3rd person to let her know her Cologuard was negative.   There was no other documentation to make her aware of any thing else.

## 2017-11-29 NOTE — Telephone Encounter (Signed)
Left message for patient to receive phone call informing patient of cologuard results.  PEC may give results in phone note.

## 2017-12-02 ENCOUNTER — Ambulatory Visit: Payer: Medicare Other | Admitting: Internal Medicine

## 2017-12-05 ENCOUNTER — Other Ambulatory Visit: Payer: Self-pay | Admitting: Internal Medicine

## 2017-12-06 ENCOUNTER — Other Ambulatory Visit: Payer: Self-pay | Admitting: Internal Medicine

## 2017-12-06 NOTE — Progress Notes (Signed)
pna 23 had 06/08/11  prevnar had 08/22/14  Tdap had 05/31/11   TMS

## 2017-12-12 ENCOUNTER — Ambulatory Visit: Payer: Medicare Other | Admitting: Internal Medicine

## 2018-01-02 ENCOUNTER — Ambulatory Visit: Payer: Self-pay

## 2018-01-02 NOTE — Telephone Encounter (Signed)
Pt. Called to report intermittent pain and numbness in left arm; stated it is sometimes painful or sometimes numb, over past 4 weeks.  Stated it goes up and down her left arm.  Today, reported pain level is 1/10, and that it varies in intensity.  Reported has been moderate at 5-7/10, at times.   Denied any swelling, redness, or warmth of left arm.  Pt. checked BP while on phone with Triage nurse; BP 136/79, P. 79, and BP 130/74.  Also reported she has "seldom tiny sharp twinges of chest pain, in mid chest, that comes and goes in a few seconds."  Denied chest pain today; denied radiation of pain.  Denied nausea, sweating, or shortness of breath associated with chest pain or left arm pain. Denied dizziness, confusion, balance issues, or temp. loss of vision with left arm numbness.  Stated she has arthritis in lower spine that causes weakness in her left leg, at times.   Attempted to schedule an appt. For pt. But she has very limited times she can schedule appt., due to her husband's Hemodialysis schedule on Tues. and Thursday.  Stated she only wants to see Dr. Shirlee Latch.  Advised will need to check with office in the AM, to find an appt. within next 2 weeks.      Reason for Disposition . [1] MILD pain (e.g., does not interfere with normal activities) AND [2] present > 7 days  Answer Assessment - Initial Assessment Questions 1. ONSET: "When did the pain start?"     4 weeks ago  2. LOCATION: "Where is the pain located?"     All up and down left arm ; sometimes painful ; sometimes numb 3. PAIN: "How bad is the pain?" (Scale 1-10; or mild, moderate, severe)   - MILD (1-3): doesn't interfere with normal activities   - MODERATE (4-7): interferes with normal activities (e.g., work or school) or awakens from sleep   - SEVERE (8-10): excruciating pain, unable to do any normal activities, unable to hold a cup of water     1/10; varies in intensity; sometimes it is an ache and sometimes it increases to 5-7/10 4. WORK  OR EXERCISE: "Has there been any recent work or exercise that involved this part of the body?"     No increased physical strain or injury 5. CAUSE: "What do you think is causing the arm pain?"     Does not know the cause 6. OTHER SYMPTOMS: "Do you have any other symptoms?" (e.g., neck pain, swelling, rash, fever, numbness, weakness)     "C/o tiny sharp pains in center of chest; more to the left"  Denies sweating, nausea, shortness of breath.  Denies dizziness, confusion, balance difficulty, or temp. Of loss of vision.  Stated does have arthritis in lower spine that causes intermittent weakness in left leg.         7. PREGNANCY: "Is there any chance you are pregnant?" "When was your last menstrual period?"     no  Protocols used: ARM PAIN-A-AH

## 2018-01-03 NOTE — Telephone Encounter (Signed)
Spoke with FC in office.  rec'd verbal okay to schedule pt. 4/11 @ 9:30 AM on Dr. Alford Highland schedule.  Notified pt. Of appt.  Pt. Agreed with plan.  Encouraged to call back if symptoms worsen prior to the appt. Verb. Understanding.

## 2018-01-09 ENCOUNTER — Ambulatory Visit (INDEPENDENT_AMBULATORY_CARE_PROVIDER_SITE_OTHER): Payer: Medicare Other | Admitting: Podiatry

## 2018-01-09 ENCOUNTER — Encounter: Payer: Self-pay | Admitting: Podiatry

## 2018-01-09 DIAGNOSIS — E119 Type 2 diabetes mellitus without complications: Secondary | ICD-10-CM

## 2018-01-09 DIAGNOSIS — M79609 Pain in unspecified limb: Secondary | ICD-10-CM

## 2018-01-09 DIAGNOSIS — B351 Tinea unguium: Secondary | ICD-10-CM

## 2018-01-09 NOTE — Progress Notes (Signed)
Complaint:  Visit Type: Patient returns to my office for continued preventative foot care services. Complaint: Patient states" my nails have grown long and thick and become painful to walk and wear shoes" Patient has been diagnosed with DM with no foot complications. The patient presents for preventative foot care services. No changes to ROS.  She has return of painful porokeratosis  Left foot.  Podiatric Exam: Vascular: dorsalis pedis and posterior tibial pulses are palpable bilateral. Capillary return is immediate. Temperature gradient is WNL. Skin turgor WNL  Sensorium: Normal Semmes Weinstein monofilament test. Normal tactile sensation bilaterally. Nail Exam: Pt has thick disfigured discolored nails with subungual debris noted bilateral entire nail hallux through fifth toenails Ulcer Exam: There is no evidence of ulcer or pre-ulcerative changes or infection. Orthopedic Exam: Muscle tone and strength are WNL. No limitations in general ROM. No crepitus or effusions noted. HAV  B/L. Skin: No Porokeratosis. No infection or ulcers  Diagnosis:  Onychomycosis, , Pain in right toe, pain in left toes,  Porokeratosis  Treatment & Plan Procedures and Treatment: Consent by patient was obtained for treatment procedures. The patient understood the discussion of treatment and procedures well. All questions were answered thoroughly reviewed. Debridement of mycotic and hypertrophic toenails, 1 through 5 bilateral and clearing of subungual debris. No ulceration, no infection noted..  Patient was sensitive so used dremel tool for thick nails.   Return Visit-Office Procedure: Patient instructed to return to the office for a follow up visit prn   for continued evaluation and treatment.    Helane Gunther DPM

## 2018-01-12 ENCOUNTER — Ambulatory Visit (INDEPENDENT_AMBULATORY_CARE_PROVIDER_SITE_OTHER): Payer: Medicare Other | Admitting: Internal Medicine

## 2018-01-12 ENCOUNTER — Encounter: Payer: Self-pay | Admitting: Internal Medicine

## 2018-01-12 ENCOUNTER — Ambulatory Visit (INDEPENDENT_AMBULATORY_CARE_PROVIDER_SITE_OTHER): Payer: Medicare Other

## 2018-01-12 VITALS — BP 116/68 | HR 69 | Temp 98.6°F | Ht 61.0 in | Wt 223.0 lb

## 2018-01-12 DIAGNOSIS — R079 Chest pain, unspecified: Secondary | ICD-10-CM | POA: Diagnosis not present

## 2018-01-12 DIAGNOSIS — I10 Essential (primary) hypertension: Secondary | ICD-10-CM | POA: Diagnosis not present

## 2018-01-12 DIAGNOSIS — G8929 Other chronic pain: Secondary | ICD-10-CM

## 2018-01-12 DIAGNOSIS — E119 Type 2 diabetes mellitus without complications: Secondary | ICD-10-CM

## 2018-01-12 DIAGNOSIS — M25512 Pain in left shoulder: Secondary | ICD-10-CM

## 2018-01-12 DIAGNOSIS — J309 Allergic rhinitis, unspecified: Secondary | ICD-10-CM | POA: Diagnosis not present

## 2018-01-12 NOTE — Patient Instructions (Signed)
Please let me know if chest pain continues would rec. Cardiology consult   Shoulder Pain Many things can cause shoulder pain, including:  An injury to the area.  Overuse of the shoulder.  Arthritis.  The source of the pain can be:  Inflammation.  An injury to the shoulder joint.  An injury to a tendon, ligament, or bone.  Follow these instructions at home: Take these actions to help with your pain:  Squeeze a soft ball or a foam pad as much as possible. This helps to keep the shoulder from swelling. It also helps to strengthen the arm.  Take over-the-counter and prescription medicines only as told by your health care provider.  If directed, apply ice to the area: ? Put ice in a plastic bag. ? Place a towel between your skin and the bag. ? Leave the ice on for 20 minutes, 2-3 times per day. Stop applying ice if it does not help with the pain.  If you were given a shoulder sling or immobilizer: ? Wear it as told. ? Remove it to shower or bathe. ? Move your arm as little as possible, but keep your hand moving to prevent swelling.  Contact a health care provider if:  Your pain gets worse.  Your pain is not relieved with medicines.  New pain develops in your arm, hand, or fingers. Get help right away if:  Your arm, hand, or fingers: ? Tingle. ? Become numb. ? Become swollen. ? Become painful. ? Turn white or blue. This information is not intended to replace advice given to you by your health care provider. Make sure you discuss any questions you have with your health care provider. Document Released: 06/30/2005 Document Revised: 05/16/2016 Document Reviewed: 01/13/2015 Elsevier Interactive Patient Education  2018 [employer]  Nonspecific Chest Pain Chest pain can be caused by many different conditions. There is always a chance that your pain could be related to something serious, such as a heart attack or a blood clot in your lungs. Chest pain can also be caused  by conditions that are not life-threatening. If you have chest pain, it is very important to follow up with your health care provider. What are the causes? Causes of this condition include:  Heartburn.  Pneumonia or bronchitis.  Anxiety or stress.  Inflammation around your heart (pericarditis) or lung (pleuritis or pleurisy).  A blood clot in your lung.  A collapsed lung (pneumothorax). This can develop suddenly on its own (spontaneous pneumothorax) or from trauma to the chest.  Shingles infection (varicella-zoster virus).  Heart attack.  Damage to the bones, muscles, and cartilage that make up your chest wall. This can include: ? Bruised bones due to injury. ? Strained muscles or cartilage due to frequent or repeated coughing or overwork. ? Fracture to one or more ribs. ? Sore cartilage due to inflammation (costochondritis).  What increases the risk? Risk factors for this condition may include:  Activities that increase your risk for trauma or injury to your chest.  Respiratory infections or conditions that cause frequent coughing.  Medical conditions or overeating that can cause heartburn.  Heart disease or family history of heart disease.  Conditions or health behaviors that increase your risk of developing a blood clot.  Having had chicken pox (varicella zoster).  What are the signs or symptoms? Chest pain can feel like:  Burning or tingling on the surface of your chest or deep in your chest.  Crushing, pressure, aching, or squeezing pain.  Dull  or sharp pain that is worse when you move, cough, or take a deep breath.  Pain that is also felt in your back, neck, shoulder, or arm, or pain that spreads to any of these areas.  Your chest pain may come and go, or it may stay constant. How is this diagnosed? Lab tests or other studies may be needed to find the cause of your pain. Your health care provider may have you take a test called an ECG (electrocardiogram).  An ECG records your heartbeat patterns at the time the test is performed. You may also have other tests, such as:  Transthoracic echocardiogram (TTE). In this test, sound waves are used to create a picture of the heart structures and to look at how blood flows through your heart.  Transesophageal echocardiogram (TEE).This is a more advanced imaging test that takes images from inside your body. It allows your health care provider to see your heart in finer detail.  Cardiac monitoring. This allows your health care provider to monitor your heart rate and rhythm in real time.  Holter monitor. This is a portable device that records your heartbeat and can help to diagnose abnormal heartbeats. It allows your health care provider to track your heart activity for several days, if needed.  Stress tests. These can be done through exercise or by taking medicine that makes your heart beat more quickly.  Blood tests.  Other imaging tests.  How is this treated? Treatment depends on what is causing your chest pain. Treatment may include:  Medicines. These may include: ? Acid blockers for heartburn. ? Anti-inflammatory medicine. ? Pain medicine for inflammatory conditions. ? Antibiotic medicine, if an infection is present. ? Medicines to dissolve blood clots. ? Medicines to treat coronary artery disease (CAD).  Supportive care for conditions that do not require medicines. This may include: ? Resting. ? Applying heat or cold packs to injured areas. ? Limiting activities until pain decreases.  Follow these instructions at home: Medicines  If you were prescribed an antibiotic, take it as told by your health care provider. Do not stop taking the antibiotic even if you start to feel better.  Take over-the-counter and prescription medicines only as told by your health care provider. Lifestyle  Do not use any products that contain nicotine or tobacco, such as cigarettes and e-cigarettes. If you need  help quitting, ask your health care provider.  Do not drink alcohol.  Make lifestyle changes as directed by your health care provider. These may include: ? Getting regular exercise. Ask your health care provider to suggest some activities that are safe for you. ? Eating a heart-healthy diet. A registered dietitian can help you to learn healthy eating options. ? Maintaining a healthy weight. ? Managing diabetes, if necessary. ? Reducing stress, such as with yoga or relaxation techniques. General instructions  Avoid any activities that bring on chest pain.  If heartburn is the cause for your chest pain, raise (elevate) the head of your bed about 6 inches (15 cm) by putting blocks under the legs. Sleeping with more pillows does not effectively relieve heartburn because it only changes the position of your head.  Keep all follow-up visits as told by your health care provider. This is important. This includes any further testing if your chest pain does not go away. Contact a health care provider if:  Your chest pain does not go away.  You have a rash with blisters on your chest.  You have a fever.  You  have chills. Get help right away if:  Your chest pain is worse.  You have a cough that gets worse, or you cough up blood.  You have severe pain in your abdomen.  You have severe weakness.  You faint.  You have sudden, unexplained chest discomfort.  You have sudden, unexplained discomfort in your arms, back, neck, or jaw.  You have shortness of breath at any time.  You suddenly start to sweat, or your skin gets clammy.  You feel nauseous or you vomit.  You suddenly feel light-headed or dizzy.  Your heart begins to beat quickly, or it feels like it is skipping beats. These symptoms may represent a serious problem that is an emergency. Do not wait to see if the symptoms will go away. Get medical help right away. Call your local emergency services (911 in the [place] Do not  drive yourself to the hospital. This information is not intended to replace advice given to you by your health care provider. Make sure you discuss any questions you have with your health care provider. Document Released: 06/30/2005 Document Revised: 06/14/2016 Document Reviewed: 06/14/2016 Elsevier Interactive Patient Education  2017 [employer].

## 2018-01-12 NOTE — Progress Notes (Signed)
Pre visit review using our clinic review tool, if applicable. No additional management support is needed unless otherwise documented below in the visit note. 

## 2018-01-15 ENCOUNTER — Other Ambulatory Visit: Payer: Self-pay | Admitting: Internal Medicine

## 2018-01-15 ENCOUNTER — Encounter: Payer: Self-pay | Admitting: Internal Medicine

## 2018-01-15 NOTE — Progress Notes (Addendum)
Chief Complaint  Patient presents with  . Follow-up   Follow up  1. Stress and tearful due to husbands recent decline in health and dx of esophageal cancer, now on dialysis, heart problems, and lung problems PHQ 9 score 5 today  2. She c/o intermittent left shoulder pain x 6 weeks radiating to left wrist and pain is "shooting".  She also c/o intermittent mid chest pain. She has had 10 episodes w/in the last 3 months. Pain is  7/10. No associated sx's n/v/sob/dizziness she does have sweating but reports its hard to tell if menopausal related vs  Related to these sx's.  She does not have a cardiologist. Husband established with Dr. Cain Saupe so would like to see him in the future but not currently declines to see cardiology. She does think onset of sx's had to do with #1 and husbands decline in health which has caused stress for her.  3. Allergic rhinitis taking tylenol cold and sinus currently and it is helping  4. HTN BP controlled on Norvasc 5, chlorthalidone 25, lisinopril 20 mg qd 5. DM 2 and HLD on januvia 100 qd and zocor 20 mg qd-need to get last records labs [facility] this will be 2nd request   Review of Systems  Constitutional:       Down 4 lbs since last visit  HENT: Negative for hearing loss.   Eyes: Negative for blurred vision.  Respiratory: Negative for shortness of breath.   Cardiovascular: Positive for chest pain.  Gastrointestinal: Negative for heartburn, nausea and vomiting.  Musculoskeletal: Positive for joint pain.  Skin: Negative for rash.  Neurological: Negative for dizziness.  Psychiatric/Behavioral:       +stress    Past Medical History:  Diagnosis Date  . Allergy   . Arthritis    hips s/p left hip prosthesis x 3, low back sees [facility] Dr. Joneen Caraway [place]  . Diabetes mellitus without complication (HCC)   . Hypertension   . Low back pain   . Shingles    2018   Past Surgical History:  Procedure Laterality Date  . JOINT  REPLACEMENT     left hip x 3   Family History  Problem Relation Age of Onset  . Early death Mother   . Hypertension Mother   . Cancer Father        spine ?MM  . Heart disease Paternal Grandmother   . Tuberculosis Paternal Grandfather   . Arthritis Cousin    Social History   Socioeconomic History  . Marital status: Married    Spouse name: Not on file  . Number of children: Not on file  . Years of education: Not on file  . Highest education level: Not on file  Occupational History  . Not on file  Social Needs  . Financial resource strain: Not on file  . Food insecurity:    Worry: Not on file    Inability: Not on file  . Transportation needs:    Medical: Not on file    Non-medical: Not on file  Tobacco Use  . Smoking status: Never Smoker  . Smokeless tobacco: Never Used  Substance and Sexual Activity  . Alcohol use: No  . Drug use: No  . Sexual activity: Never  Lifestyle  . Physical activity:    Days per week: Not on file    Minutes per session: Not on file  . Stress: Not on file  Relationships  . Social connections:  Talks on phone: Not on file    Gets together: Not on file    Attends religious service: Not on file    Active member of club or organization: Not on file    Attends meetings of clubs or organizations: Not on file    Relationship status: Not on file  . Intimate partner violence:    Fear of current or ex partner: Not on file    Emotionally abused: Not on file    Physically abused: Not on file    Forced sexual activity: Not on file  Other Topics Concern  . 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 amLODipine (NORVASC) 5 MG tablet   . aspirin 325 MG tablet aspirin 325 mg tablet,delayed release  TAKE 1 TABLET BY MOUTH TWICE A DAY FOR 6 WEEKS AFTER  SURGERY TO DECREASE RISK OF CLOTS  . chlorthalidone (HYGROTON) 25 MG tablet   . fluticasone (FLONASE) 50 MCG/ACT nasal spray Place into the nose.  . gabapentin (NEURONTIN) 100 MG capsule Take 100 mg by mouth 2 (two) times daily.   Marland Kitchen JANUVIA 100 MG tablet   . lisinopril (PRINIVIL,ZESTRIL) 20 MG tablet   . meloxicam (MOBIC) 15 MG tablet Take 1 tablet (15 mg total) by mouth daily.  . pantoprazole (PROTONIX) 40 MG tablet   . simvastatin (ZOCOR) 20 MG tablet 20 mg.    Allergies  Allergen Reactions  . Sulfa Antibiotics Hives and Dermatitis   Recent Results (from the past 2160 hour(s))  Rheumatoid Factor     Status: None   Collection Time: 11/03/17  9:52 AM  Result Value Ref Range   Rhuematoid fact SerPl-aCnc <14 <14 IU/mL  Antinuclear Antib (ANA)     Status: None   Collection Time: 11/03/17  9:52 AM  Result Value Ref Range   Anit Nuclear Antibody(ANA) NEGATIVE NEGATIVE    Comment: ANA IFA is a first line screen for detecting the presence of up to approximately 150 autoantibodies in various autoimmune diseases. A negative ANA IFA result suggests ANA-associated autoimmune diseases are not present at this time. . Visit Physician FAQs for interpretation of all antibodies in the [place], prevalence, and association with diseases at http://education.[facility].com/ [email or web address] .   Cyclic citrul peptide antibody, IgG     Status: None   Collection Time: 11/03/17  9:52 AM  Result Value Ref Range   Cyclic Citrullin Peptide Ab <44 UNITS    Comment: Reference Range Negative:            <20 Weak Positive:       20-39 Moderate Positive:   40-59 Strong Positive:     >59 .   C-reactive protein     Status: Abnormal   Collection Time: 11/03/17  9:52 AM  Result Value Ref Range   CRP 0.4 (L) 0.5 - 20.0 mg/dL  Sedimentation rate     Status: None   Collection Time: 11/03/17  9:55 AM  Result Value Ref Range   Sed Rate 25 0 - 30 mm/h  Cologuard     Status: None   Collection Time:  11/14/17 12:00 AM  Result Value Ref Range   Cologuard Negative    Objective  Body mass index is 42.14  kg/m. Wt Readings from Last 3 Encounters:  01/12/18 223 lb (101.2 kg)  11/03/17 227 lb 3.2 oz (103.1 kg)  03/30/15 221 lb (100.2 kg)   Temp Readings from Last 3 Encounters:  01/12/18 98.6 F (37 C) (Oral)  11/03/17 98.5 F (36.9 C) (Oral)  03/30/15 98.3 F (36.8 C) (Oral)   BP Readings from Last 3 Encounters:  01/12/18 116/68  11/03/17 132/70  07/06/16 (!) 147/88   Pulse Readings from Last 3 Encounters:  01/12/18 69  11/03/17 74  07/06/16 74    Physical Exam  Constitutional: She is oriented to person, place, and time. Vital signs are normal. She appears well-developed.  HENT:  Head: Normocephalic and atraumatic.  Mouth/Throat: Oropharynx is clear and moist and mucous membranes are normal.  Eyes: Pupils are equal, round, and reactive to light. Conjunctivae are normal.  Cardiovascular: Normal rate, regular rhythm and normal heart sounds.  CP not reproducible  Pulmonary/Chest: Effort normal and breath sounds normal.  Musculoskeletal: Normal range of motion. She exhibits no tenderness.       Left shoulder: She exhibits normal range of motion, no tenderness and no bony tenderness.  Neurological: She is alert and oriented to person, place, and time. Gait normal.  Skin: Skin is warm, dry and intact.  Psychiatric: She has a normal mood and affect. Her speech is normal and behavior is normal. Judgment and thought content normal. Cognition and memory are normal.  Nursing note and vitals reviewed.   Assessment   1. Chest pain ddx cardiac vs stress, denies GERD sx and on PPI 2. Left shoulder pain  3. HTN  4. DM 2  5. Allergic rhinitis  6. HM  Plan  1.  EKG today NSR no ST elevation, no TWI rec pt see cardiologist further w/u she declines for now and wants to wait but husband sees Dr. Cain Saupe would want to see him as well she has never seen cardiology  Consider  ordering stress test and CT cardiac scoring test in interim  2. Xray today negative  3. Cont meds  4. Cont meds  5. Prn Flonase, taking Tylenol cold and sinus currently  6.  Had flu shot 07/10/17 Tdap 2018 per pt epic notes 05/31/11  -[facility] had 06/27/15, pna 23 08/22/14,  prevnar UTD and pna 23 need to check date  Had shingrix 1/2 05/02/17 and 2/2 07/2017   Need to confirm all above vaccines from records from [facility] or local pharmacy [facility] now [facility] on [address]  Out of age window pap never had hysterectomy LMP 55  Colonoscopy had 5-6 years/2014 ago in [place] but cant remember where but negative.cologuard neg late 11/2017  DEXA will check [facility] records had 11/06/13 mild osteopenia spine nl hip-need to make sure taking vit D3 1000 IU and calcium 600 mg bid at f/u  -DEXA 09/07/16 normal AP spine, femoral neck right, total hip right osteopenia  Mammogram due 07/28/18 had 1 year ago this date and neg  Never smoker   Need to check records to see if tested hep B/C in past h/o fatty liver US 03/04/14 and needs hep A/B vaccine if has not had    Obtained records [facility]:  DEXA reviewed see above  Labs 01/10/17  CBC normal, CMET Cr 0.88 GFR 77, lipid TC 170, TG 63, HDL 62, LDL 95, A1C 5.8  Labs 04/13/17 CBC normal Cr 0.96 GFR 69 lipid TC 169, TG 65, HDL 55, LDL 101 , A1C 5.7  Echo (not found), images (not found), labs reviewed, vaccines reviewed, notes reviewed  [facility]ed [facility] notes last saw Dr. Valrie Hart 07/11/17  [place] 03/04/14 fatty liver, shingles 07/2016,left hip replacement 03/2015  Esophageal stricture with dilatation history  S/p elbow fracture b/l right and left  Pending records [facility] Xrays    Provider: Dr. French Ana McLean-Scocuzza-[facility]

## 2018-01-19 ENCOUNTER — Telehealth: Payer: Self-pay

## 2018-01-19 NOTE — Telephone Encounter (Signed)
See below

## 2018-01-19 NOTE — Telephone Encounter (Signed)
Left message for opatient to return call back. Patient needs to be informed of what Dr. French Ana stated.  PEC may give and obtain information

## 2018-01-19 NOTE — Telephone Encounter (Signed)
-----   Message from Bevelyn Buckles, MD sent at 01/15/2018  7:54 PM EDT ----- Call pt and see if she wants me to ordered 1) stress test at the hospital to check out her heart and also 2) coronary calcium score test to see if she has plaque build up in heart with + calcium score   This is since she does not want to see cardiology right now  Document this in note as well so I have this in the chart   Thanks tMS

## 2018-01-19 NOTE — Telephone Encounter (Signed)
Patient called back and said yes to please order both, she said do whatever you want to do.

## 2018-01-22 ENCOUNTER — Other Ambulatory Visit: Payer: Self-pay | Admitting: Internal Medicine

## 2018-01-22 DIAGNOSIS — E119 Type 2 diabetes mellitus without complications: Secondary | ICD-10-CM

## 2018-01-22 DIAGNOSIS — I209 Angina pectoris, unspecified: Secondary | ICD-10-CM

## 2018-01-22 DIAGNOSIS — R079 Chest pain, unspecified: Secondary | ICD-10-CM

## 2018-01-22 NOTE — Telephone Encounter (Signed)
Please sch  1. Exercise treadmill test [facility]  And  2. CT cardiac scoring at [facility]   Thanks TMS

## 2018-01-23 ENCOUNTER — Telehealth: Payer: Self-pay | Admitting: Internal Medicine

## 2018-01-23 NOTE — Telephone Encounter (Signed)
Dr. French Ana, I called her to let her know that with the CT cardiac scoring test that they require her to pay $150 up front because it is a screening image and insurance will not pay for it. She wants to know if this test is necessary because she does not want to pay anything out of pocket d/t all of the other things that they are paying out for because of her husbands care.

## 2018-01-23 NOTE — Telephone Encounter (Signed)
I wanted to do this due to her c/o chest pain and EKG normal to assess risk and she does not want to go to cardiology at the time   TMS

## 2018-01-24 NOTE — Telephone Encounter (Signed)
She is declining the cardiac scoring. She will be completing her stress test tomorrow.

## 2018-01-24 NOTE — Telephone Encounter (Signed)
Ok thanks TMS 

## 2018-01-25 ENCOUNTER — Ambulatory Visit
Admission: RE | Admit: 2018-01-25 | Discharge: 2018-01-25 | Disposition: A | Discharge status: Home / Self Care | Payer: Medicare Other | Source: Ambulatory Visit | Attending: Internal Medicine | Admitting: Internal Medicine

## 2018-01-25 DIAGNOSIS — R079 Chest pain, unspecified: Secondary | ICD-10-CM

## 2018-01-25 LAB — EXERCISE TOLERANCE TEST
CHL CUP RESTING HR STRESS: 63 {beats}/min
CSEPEDS: 55 s
Estimated workload: 5.2 METS
Exercise duration (min): 3 min
Peak HR: 129 {beats}/min
Percent HR: 86 %

## 2018-01-26 ENCOUNTER — Ambulatory Visit: Payer: Medicare Other

## 2018-02-10 ENCOUNTER — Other Ambulatory Visit: Payer: Self-pay | Admitting: Internal Medicine

## 2018-02-10 DIAGNOSIS — E669 Obesity, unspecified: Secondary | ICD-10-CM

## 2018-02-10 MED ORDER — PHENTERMINE HCL 37.5 MG PO TABS: 37.5000 mg | ORAL_TABLET | Freq: Every day | ORAL | 0 refills | Status: DC

## 2018-03-09 ENCOUNTER — Ambulatory Visit (INDEPENDENT_AMBULATORY_CARE_PROVIDER_SITE_OTHER): Payer: Medicare Other | Admitting: Internal Medicine

## 2018-03-09 ENCOUNTER — Encounter: Payer: Self-pay | Admitting: Internal Medicine

## 2018-03-09 VITALS — BP 110/76 | HR 87 | Temp 98.3°F | Ht 61.0 in | Wt 224.4 lb

## 2018-03-09 DIAGNOSIS — K760 Fatty (change of) liver, not elsewhere classified: Secondary | ICD-10-CM

## 2018-03-09 DIAGNOSIS — Z1159 Encounter for screening for other viral diseases: Secondary | ICD-10-CM

## 2018-03-09 DIAGNOSIS — K222 Esophageal obstruction: Secondary | ICD-10-CM | POA: Diagnosis not present

## 2018-03-09 DIAGNOSIS — E119 Type 2 diabetes mellitus without complications: Secondary | ICD-10-CM

## 2018-03-09 DIAGNOSIS — I10 Essential (primary) hypertension: Secondary | ICD-10-CM | POA: Diagnosis not present

## 2018-03-09 DIAGNOSIS — M8580 Other specified disorders of bone density and structure, unspecified site: Secondary | ICD-10-CM

## 2018-03-09 DIAGNOSIS — E559 Vitamin D deficiency, unspecified: Secondary | ICD-10-CM

## 2018-03-09 DIAGNOSIS — R079 Chest pain, unspecified: Secondary | ICD-10-CM

## 2018-03-09 DIAGNOSIS — Z6841 Body Mass Index (BMI) 40.0 and over, adult: Secondary | ICD-10-CM

## 2018-03-09 DIAGNOSIS — Z1329 Encounter for screening for other suspected endocrine disorder: Secondary | ICD-10-CM | POA: Diagnosis not present

## 2018-03-09 DIAGNOSIS — Z13818 Encounter for screening for other digestive system disorders: Secondary | ICD-10-CM

## 2018-03-09 MED ORDER — LIRAGLUTIDE -WEIGHT MANAGEMENT 18 MG/3ML ~~LOC~~ SOPN: 0.6000 mg | PEN_INJECTOR | Freq: Every day | SUBCUTANEOUS | 11 refills | Status: DC

## 2018-03-09 NOTE — Progress Notes (Signed)
Chief Complaint  Patient presents with  . Follow-up   F/u  1. Left shoulder pain improved  2. C/o intermittent chest pain and FH heart disease 01/2018 stress test normal at [facility] due to fact still having CP will refer to cardiology she wants to see Dr. Milta Deiters where husband goes  3. Obesity on adipex not losing wt and eating healthy no pork or red meat or sweets disc Saxenda she is agreeable to try  4. HTN controlled on lis 20, chlorthalidone 25, norvasc 5 mg qd  5. DM 2 last A1c 5.7 cbg this am 176 on januvia 100 mg qd per pt had eye exam my eye MD  6. H/o esophageal stricture and ulcers due to f/u EGD in 1 year   Review of Systems  Constitutional: Negative for weight loss.  HENT: Negative for hearing loss.   Eyes: Negative for blurred vision.  Respiratory: Negative for shortness of breath.   Cardiovascular: Positive for chest pain.  Gastrointestinal: Negative for abdominal pain.  Musculoskeletal: Positive for joint pain.  Skin: Negative for rash.  Neurological: Negative for headaches.  Psychiatric/Behavioral:       +stress due to husbands health   Past Medical History:  Diagnosis Date  . Allergy   . Arthritis    hips s/p left hip prosthesis x 3, low back sees [facility] Dr. Joneen Caraway [place]  . Diabetes mellitus without complication (HCC)   . Hypertension   . Low back pain   . Shingles    2018   Past Surgical History:  Procedure Laterality Date  . JOINT REPLACEMENT     left hip x 3   Family History  Problem Relation Age of Onset  . Early death Mother   . Hypertension Mother   . Cancer Father        spine ?MM  . Heart disease Paternal Grandmother   . Tuberculosis Paternal Grandfather   . Arthritis Cousin    Social History   Socioeconomic History  . Marital status: Married    Spouse name: Not on file  . Number of children: Not on file  . Years of education: Not on file  . Highest education level: Not on file  Occupational History  . Not on file   Social Needs  . Financial resource strain: Not on file  . Food insecurity:    Worry: Not on file    Inability: Not on file  . Transportation needs:    Medical: Not on file    Non-medical: Not on file  Tobacco Use  . Smoking status: Never Smoker  . Smokeless tobacco: Never Used  Substance and Sexual Activity  . Alcohol use: No  . Drug use: No  . Sexual activity: Never  Lifestyle  . Physical activity:    Days per week: Not on file    Minutes per session: Not on file  . Stress: Not on file  Relationships  . Social connections:    Talks on phone: Not on file    Gets together: Not on file    Attends religious service: Not on file    Active member of club or organization: Not on file    Attends meetings of clubs or organizations: Not on file    Relationship status: Not on file  . Intimate partner violence:    Fear of current or ex partner: Not on file    Emotionally abused: Not on file    Physically abused: Not on file  Forced sexual activity: Not on file  Other Topics Concern  . 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 amLODipine (NORVASC) 5 MG tablet   . aspirin 325 MG tablet aspirin 325 mg tablet,delayed release  TAKE 1 TABLET BY MOUTH TWICE A DAY FOR 6 WEEKS AFTER SURGERY TO DECREASE RISK OF CLOTS  . chlorthalidone (HYGROTON) 25 MG tablet   . fluticasone (FLONASE) 50 MCG/ACT nasal spray Place into the nose.  . gabapentin (NEURONTIN) 100 MG capsule Take 100 mg by mouth 2 (two) times daily.   Marland Kitchen JANUVIA 100 MG tablet   . lisinopril (PRINIVIL,ZESTRIL) 20 MG tablet   . meloxicam (MOBIC) 15 MG tablet Take 1 tablet (15 mg total) by mouth daily.  . pantoprazole (PROTONIX) 40 MG tablet   . phentermine (ADIPEX-P) 37.5 MG tablet Take 1 tablet (37.5 mg total) by mouth daily before  breakfast.  . simvastatin (ZOCOR) 20 MG tablet 20 mg.    Allergies  Allergen Reactions  . Sulfa Antibiotics Hives and Dermatitis   Recent Results (from the past 2160 hour(s))  Exercise Tolerance Test     Status: None   Collection Time: 01/25/18  6:34 PM  Result Value Ref Range   Rest HR 63 bpm   Rest BP 143/68 mmHg   Exercise duration (sec) 55 sec   Percent HR 86 %   Exercise duration (min) 3 min   Estimated workload 5.2 METS   Peak HR 129 bpm   Peak BP 168/58 mmHg   Objective  Body mass index is 42.4 kg/m. Wt Readings from Last 3 Encounters:  03/09/18 224 lb 6.4 oz (101.8 kg)  01/12/18 223 lb (101.2 kg)  11/03/17 227 lb 3.2 oz (103.1 kg)   Temp Readings from Last 3 Encounters:  03/09/18 98.3 F (36.8 C) (Oral)  01/12/18 98.6 F (37 C) (Oral)  11/03/17 98.5 F (36.9 C) (Oral)   BP Readings from Last 3 Encounters:  03/09/18 110/76  01/12/18 116/68  11/03/17 132/70   Pulse Readings from Last 3 Encounters:  03/09/18 87  01/12/18 69  11/03/17 74    Physical Exam  Constitutional: She is oriented to person, place, and time. Vital signs are normal. She appears well-developed and well-nourished. She is cooperative.  HENT:  Head: Normocephalic and atraumatic.  Mouth/Throat: Oropharynx is clear and moist and mucous membranes are normal.  Eyes: Pupils are equal, round, and reactive to light. Conjunctivae are normal.  Cardiovascular: Normal rate, regular rhythm and normal heart sounds.  Pulmonary/Chest: Effort normal and breath sounds normal.  Neurological: She is alert and oriented to person, place, and time. Gait normal.  Skin: Skin is warm, dry and intact.  Psychiatric: She has a normal mood and affect. Her speech is normal and behavior is normal. Judgment and thought content normal. Cognition and memory are normal.  Nursing note and vitals reviewed.   Assessment   1. Left shoulder pain resolved  2. Chest pain  3. Obesity BMI 42.4  4. HTN  5. DM 2  6.  Esophageal stricture, GI ulcers 7. HM Plan   1. Monitor  2. Stress test 01/2018 neg  Refer to Dr. Milta Deiters to consider CCTA coronaries   3. Failed adipex  try to get saxenda approved  4. Cont meds  5. Cont januvia 100 mg qd if saxenda approved reduce januvia to 50 mg qd  Get records My eye doctor at f/u  6. EGD due in 1 year  7.  Had flu shot 07/10/17 Tdap 2018 ? per [facility] had 06/27/15 or 05/31/11  pna 23 06/08/11 per [facility]  prevnar 08/22/14   Had shingrix 1/2 05/02/17 and 2/2 07/2017  Need to check [facility] on all vaccines as well   Out of age window pap never had hysterectomy LMP 55  Colonoscopy [place] 5-6 years/2014 ago in [place] but cant remember where but negative.cologuard neg late 11/2017  DEXA 11/06/13 mild osteopenia spine nl hip-need to make sure taking vit D3 1000 IU and calcium 600 mg bid DEXA 09/07/16 normal AP spine, femoral neck right, total hip right osteopenia  Mammogram due 07/28/18 had 1 year ago this date and neg  Never smoker   Provider: Dr. French Ana McLean-Scocuzza-Internal Medicine

## 2018-03-09 NOTE — Patient Instructions (Signed)
Schedule fasting labs 04/13/18  Saxenda/Liraglutide injection (Weight Management) What is this medicine? LIRAGLUTIDE (LIR a GLOO tide) is used with a reduced calorie diet and exercise to help you lose weight. This medicine may be used for other purposes; ask your health care provider or pharmacist if you have questions. COMMON BRAND NAME(S): Saxenda What should I tell my health care provider before I take this medicine? They need to know if you have any of these conditions: -endocrine tumors (MEN 2) or if someone in your family had these tumors -gallbladder disease -high cholesterol -history of alcohol abuse problem -history of pancreatitis -kidney disease or if you are on dialysis -liver disease -previous swelling of the tongue, face, or lips with difficulty breathing, difficulty swallowing, hoarseness, or tightening of the throat -stomach problems -suicidal thoughts, plans, or attempt; a previous suicide attempt by you or a family member -thyroid cancer or if someone in your family had thyroid cancer -an unusual or allergic reaction to liraglutide, other medicines, foods, dyes, or preservatives -pregnant or trying to get pregnant -breast-feeding How should I use this medicine? This medicine is for injection under the skin of your upper leg, stomach area, or upper arm. You will be taught how to prepare and give this medicine. Use exactly as directed. Take your medicine at regular intervals. Do not take it more often than directed. It is important that you put your used needles and syringes in a special sharps container. Do not put them in a trash can. If you do not have a sharps container, call your pharmacist or healthcare provider to get one. A special MedGuide will be given to you by the pharmacist with each prescription and refill. Be sure to read this information carefully each time. Talk to your pediatrician regarding the use of this medicine in children. Special care may be  needed. Overdosage: If you think you have taken too much of this medicine contact a poison control center or emergency room at once. NOTE: This medicine is only for you. Do not share this medicine with others. What if I miss a dose? If you miss a dose, take it as soon as you can. If it is almost time for your next dose, take only that dose. Do not take double or extra doses. If you miss your dose for 3 days or more, call your doctor or health care professional to talk about how to restart this medicine. What may interact with this medicine? -insulin and other medicines for diabetes This list may not describe all possible interactions. Give your health care provider a list of all the medicines, herbs, non-prescription drugs, or dietary supplements you use. Also tell them if you smoke, drink alcohol, or use illegal drugs. Some items may interact with your medicine. What should I watch for while using this medicine? Visit your doctor or health care professional for regular checks on your progress. This medicine is intended to be used in addition to a healthy diet and appropriate exercise. The best results are achieved this way. Do not increase or in any way change your dose without consulting your doctor or health care professional. Drink plenty of fluids while taking this medicine. Check with your doctor or health care professional if you get an attack of severe diarrhea, nausea, and vomiting. The loss of too much body fluid can make it dangerous for you to take this medicine. This medicine may affect blood sugar levels. If you have diabetes, check with your doctor or health care professional  before you change your diet or the dose of your diabetic medicine. Patients and their families should watch out for worsening depression or thoughts of suicide. Also watch out for sudden changes in feelings such as feeling anxious, agitated, panicky, irritable, hostile, aggressive, impulsive, severely restless, overly  excited and hyperactive, or not being able to sleep. If this happens, especially at the beginning of treatment or after a change in dose, call your health care professional. What side effects may I notice from receiving this medicine? Side effects that you should report to your doctor or health care professional as soon as possible: -allergic reactions like skin rash, itching or hives, swelling of the face, lips, or tongue -breathing problems -diarrhea that continues or is severe -lump or swelling on the neck -severe nausea -signs and symptoms of infection like fever or chills; cough; sore throat; pain or trouble passing urine -signs and symptoms of low blood sugar such as feeling anxious, confusion, dizziness, increased hunger, unusually weak or tired, sweating, shakiness, cold, irritable, headache, blurred vision, fast heartbeat, loss of consciousness -signs and symptoms of kidney injury like trouble passing urine or change in the amount of urine -trouble swallowing -unusual stomach upset or pain -vomiting Side effects that usually do not require medical attention (report to your doctor or health care professional if they continue or are bothersome): -constipation -decreased appetite -diarrhea -fatigue -headache -nausea -pain, redness, or irritation at site where injected -stomach upset -stuffy or runny nose This list may not describe all possible side effects. Call your doctor for medical advice about side effects. You may report side effects to FDA at 1-800-FDA-1088. Where should I keep my medicine? Keep out of the reach of children. Store unopened pen in a refrigerator between 2 and 8 degrees C (36 and 46 degrees F). Do not freeze or use if the medicine has been frozen. Protect from light and excessive heat. After you first use the pen, it can be stored at room temperature between 15 and 30 degrees C (59 and 86 degrees F) or in a refrigerator. Throw away your used pen after 30 days or  after the expiration date, whichever comes first. Do not store your pen with the needle attached. If the needle is left on, medicine may leak from the pen. NOTE: This sheet is a summary. It may not cover all possible information. If you have questions about this medicine, talk to your doctor, pharmacist, or health care provider.  2018 Elsevier/Gold Standard (2016-10-07 14:41:37)

## 2018-03-10 NOTE — Progress Notes (Signed)
Immunizations updates from [facility] 

## 2018-03-13 ENCOUNTER — Telehealth: Payer: Self-pay | Admitting: Internal Medicine

## 2018-03-13 NOTE — Telephone Encounter (Signed)
Copied from CRM [id number]. Topic: Quick Communication - See Telephone Encounter >> Mar 13, 2018  9:59 AM Windy Kalata, NT wrote: CRM for notification. See Telephone encounter for: 03/13/18.  Patient is calling and states Liraglutide -Weight Management (SAXENDA) 18 MG/3ML SOPN is needing a pre authorization. Also patient states non of her medicine is supposed to be going to [facility].  [address] - [place], [place] - [address]  [address] [place] [address]  Phone: [phone] Fax: [phone]

## 2018-03-13 NOTE — Telephone Encounter (Signed)
Prior authorization completed on 03-13-18.

## 2018-03-15 ENCOUNTER — Encounter: Payer: Self-pay | Admitting: Internal Medicine

## 2018-03-15 DIAGNOSIS — I10 Essential (primary) hypertension: Secondary | ICD-10-CM | POA: Diagnosis not present

## 2018-03-15 DIAGNOSIS — R011 Cardiac murmur, unspecified: Secondary | ICD-10-CM | POA: Diagnosis not present

## 2018-03-15 DIAGNOSIS — K210 Gastro-esophageal reflux disease with esophagitis: Secondary | ICD-10-CM | POA: Diagnosis not present

## 2018-03-15 DIAGNOSIS — E668 Other obesity: Secondary | ICD-10-CM | POA: Diagnosis not present

## 2018-03-15 DIAGNOSIS — R079 Chest pain, unspecified: Secondary | ICD-10-CM | POA: Diagnosis not present

## 2018-03-17 ENCOUNTER — Telehealth: Payer: Self-pay

## 2018-03-17 NOTE — Telephone Encounter (Signed)
Copied from CRM [id number]. Topic: Quick Communication - See Telephone Encounter >> Mar 13, 2018  9:59 AM Windy Kalata, NT wrote: CRM for notification. See Telephone encounter for: 03/13/18.  Patient is calling and states Liraglutide -Weight Management (SAXENDA) 18 MG/3ML SOPN is needing a pre authorization.  >> Mar 16, 2018  2:29 PM Floria Raveling A wrote: Pt called and stated that this med was sent to [facility].  This med needs to be resent to [facility]   She does not use the [facility] at all   Prior auth for saxenda was completed on 03-17-18. Waiting for approval.

## 2018-03-30 ENCOUNTER — Telehealth: Payer: Self-pay | Admitting: Internal Medicine

## 2018-03-30 NOTE — Telephone Encounter (Signed)
See prior note   TMS 

## 2018-03-30 NOTE — Telephone Encounter (Signed)
Pt called and disc husbands health with me and information on appt card was wrong about her appts   Please call and confirm appts and mail new appt cards   Thanks TMS

## 2018-03-31 DIAGNOSIS — I340 Nonrheumatic mitral (valve) insufficiency: Secondary | ICD-10-CM | POA: Diagnosis not present

## 2018-03-31 DIAGNOSIS — R0602 Shortness of breath: Secondary | ICD-10-CM | POA: Diagnosis not present

## 2018-03-31 DIAGNOSIS — R011 Cardiac murmur, unspecified: Secondary | ICD-10-CM | POA: Diagnosis not present

## 2018-03-31 DIAGNOSIS — I2510 Atherosclerotic heart disease of native coronary artery without angina pectoris: Secondary | ICD-10-CM | POA: Diagnosis not present

## 2018-03-31 DIAGNOSIS — I351 Nonrheumatic aortic (valve) insufficiency: Secondary | ICD-10-CM | POA: Diagnosis not present

## 2018-03-31 DIAGNOSIS — I10 Essential (primary) hypertension: Secondary | ICD-10-CM | POA: Diagnosis not present

## 2018-04-10 ENCOUNTER — Ambulatory Visit: Payer: Medicare Other | Admitting: Podiatry

## 2018-04-13 ENCOUNTER — Other Ambulatory Visit (INDEPENDENT_AMBULATORY_CARE_PROVIDER_SITE_OTHER): Payer: Medicare Other

## 2018-04-13 DIAGNOSIS — K760 Fatty (change of) liver, not elsewhere classified: Secondary | ICD-10-CM | POA: Diagnosis not present

## 2018-04-13 DIAGNOSIS — Z13818 Encounter for screening for other digestive system disorders: Secondary | ICD-10-CM | POA: Diagnosis not present

## 2018-04-13 DIAGNOSIS — E559 Vitamin D deficiency, unspecified: Secondary | ICD-10-CM | POA: Diagnosis not present

## 2018-04-13 DIAGNOSIS — I10 Essential (primary) hypertension: Secondary | ICD-10-CM | POA: Diagnosis not present

## 2018-04-13 DIAGNOSIS — Z1329 Encounter for screening for other suspected endocrine disorder: Secondary | ICD-10-CM

## 2018-04-13 DIAGNOSIS — Z1159 Encounter for screening for other viral diseases: Secondary | ICD-10-CM

## 2018-04-13 DIAGNOSIS — E119 Type 2 diabetes mellitus without complications: Secondary | ICD-10-CM

## 2018-04-13 LAB — COMPREHENSIVE METABOLIC PANEL
ALT: 18 U/L (ref 0–35)
AST: 21 U/L (ref 0–37)
Albumin: 4.3 g/dL (ref 3.5–5.2)
Alkaline Phosphatase: 55 U/L (ref 39–117)
BILIRUBIN TOTAL: 0.7 mg/dL (ref 0.2–1.2)
BUN: 21 mg/dL (ref 6–23)
CO2: 29 meq/L (ref 19–32)
CREATININE: 0.89 mg/dL (ref 0.40–1.20)
Calcium: 9.6 mg/dL (ref 8.4–10.5)
Chloride: 105 mEq/L (ref 96–112)
GFR: 80.16 mL/min (ref >60.00)
GLUCOSE: 102 mg/dL — AB (ref 70–99)
Potassium: 3.7 mEq/L (ref 3.5–5.1)
Sodium: 142 mEq/L (ref 135–145)
TOTAL PROTEIN: 7.6 g/dL (ref 6.0–8.3)

## 2018-04-13 LAB — CBC WITH DIFFERENTIAL/PLATELET
Basophils Absolute: 0.1 10*3/uL (ref 0.0–0.1)
Basophils Relative: 1.2 % (ref 0.0–3.0)
EOS ABS: 0.3 10*3/uL (ref 0.0–0.7)
Eosinophils Relative: 5.2 % — ABNORMAL HIGH (ref 0.0–5.0)
HCT: 41.5 % (ref 36.0–46.0)
Hemoglobin: 14.1 g/dL (ref 12.0–15.0)
LYMPHS ABS: 1.8 10*3/uL (ref 0.7–4.0)
Lymphocytes Relative: 36.1 % (ref 12.0–46.0)
MCHC: 34 g/dL (ref 30.0–36.0)
MCV: 86.9 fl (ref 78.0–100.0)
MONO ABS: 0.5 10*3/uL (ref 0.1–1.0)
Monocytes Relative: 10.2 % (ref 3.0–12.0)
NEUTROS ABS: 2.3 10*3/uL (ref 1.4–7.7)
NEUTROS PCT: 47.3 % (ref 43.0–77.0)
PLATELETS: 269 10*3/uL (ref 150.0–400.0)
RBC: 4.78 Mil/uL (ref 3.87–5.11)
RDW: 14.1 % (ref 11.5–15.5)
WBC: 4.9 10*3/uL (ref 4.0–10.5)

## 2018-04-13 LAB — LIPID PANEL
CHOLESTEROL: 142 mg/dL (ref 0–200)
HDL: 60.3 mg/dL (ref >39.00)
LDL Cholesterol: 70 mg/dL (ref 0–99)
NONHDL: 81.7
Total CHOL/HDL Ratio: 2
Triglycerides: 60 mg/dL (ref 0.0–149.0)
VLDL: 12 mg/dL (ref 0.0–40.0)

## 2018-04-13 LAB — VITAMIN D 25 HYDROXY (VIT D DEFICIENCY, FRACTURES): VITD: 35.84 ng/mL (ref 30.00–100.00)

## 2018-04-13 LAB — TSH: TSH: 0.6 u[IU]/mL (ref 0.35–4.50)

## 2018-04-13 LAB — HEMOGLOBIN A1C: Hgb A1c MFr Bld: 6.1 % (ref 4.6–6.5)

## 2018-04-14 LAB — HEPATITIS C ANTIBODY
Hepatitis C Ab: NONREACTIVE
SIGNAL TO CUT-OFF: 0.01 (ref <1.00)

## 2018-04-14 LAB — URINALYSIS, ROUTINE W REFLEX MICROSCOPIC
BILIRUBIN UA: NEGATIVE
Glucose, UA: NEGATIVE
Nitrite, UA: NEGATIVE
Protein, UA: NEGATIVE
RBC UA: NEGATIVE
Specific Gravity, UA: 1.027 (ref 1.005–1.030)
Urobilinogen, Ur: 1 mg/dL (ref 0.2–1.0)
pH, UA: 6 (ref 5.0–7.5)

## 2018-04-14 LAB — MICROSCOPIC EXAMINATION
Bacteria, UA: NONE SEEN
CASTS: NONE SEEN /LPF

## 2018-04-14 LAB — HEPATITIS B SURFACE ANTIBODY, QUANTITATIVE: Hepatitis B-Post: <5 m[IU]/mL — ABNORMAL LOW (ref >=10)

## 2018-04-14 LAB — HEPATITIS A ANTIBODY, TOTAL: Hepatitis A AB,Total: NONREACTIVE

## 2018-04-14 LAB — MICROALBUMIN / CREATININE URINE RATIO
CREATININE, UR: 180.3 mg/dL
Microalb/Creat Ratio: 3.7 mg/g creat (ref 0.0–30.0)
Microalbumin, Urine: 6.7 ug/mL

## 2018-04-14 LAB — HEPATITIS A ANTIBODY, IGM: Hep A IgM: NONREACTIVE

## 2018-04-14 LAB — HEPATITIS B SURFACE ANTIGEN: Hepatitis B Surface Ag: NONREACTIVE

## 2018-04-18 ENCOUNTER — Telehealth: Payer: Self-pay | Admitting: Internal Medicine

## 2018-04-18 NOTE — Telephone Encounter (Signed)
Pt called in and was given lab results ordered by Dr. Judie Grieve on 04/13/18.  Urine normal some ketones could be due to fasting. Liver, kidneys normal. Vitamin D and blood counts normal A1C 6.1 at goal Thyroid lab normal.  Consider Hepatitis A/B vaccine with h/o fatty liver noted on [place] 2015.  I scheduled her for her first dose of Hepatitis A & B vaccine  On 04/25/18 at 3:30 with the nurse.

## 2018-04-21 ENCOUNTER — Telehealth: Payer: Self-pay | Admitting: *Deleted

## 2018-04-21 NOTE — Telephone Encounter (Signed)
Pt has multiple questions regarding pending Hep A and B vaccines. Also questions related to noted "H/O fatty liver." NT attempted to answer all questions. Requests CB from office.  Please advise: [phone]

## 2018-04-24 ENCOUNTER — Telehealth: Payer: Self-pay | Admitting: *Deleted

## 2018-04-24 NOTE — Telephone Encounter (Signed)
Copied from CRM [id number]. Topic: Inquiry >> Apr 21, 2018  2:08 PM Windy Kalata, NT wrote: Reason for CRM: patient is calling and would like for Dr. Shirlee Latch to give her a call in regards to the vaccinations she is scheduled to get on 04/26/18

## 2018-04-25 ENCOUNTER — Ambulatory Visit: Payer: Medicare Other

## 2018-04-26 ENCOUNTER — Ambulatory Visit (INDEPENDENT_AMBULATORY_CARE_PROVIDER_SITE_OTHER): Payer: Medicare Other | Admitting: *Deleted

## 2018-04-26 DIAGNOSIS — Z23 Encounter for immunization: Secondary | ICD-10-CM | POA: Diagnosis not present

## 2018-04-26 DIAGNOSIS — K760 Fatty (change of) liver, not elsewhere classified: Secondary | ICD-10-CM

## 2018-04-26 NOTE — Progress Notes (Signed)
Patient tolerated well.

## 2018-04-27 NOTE — Telephone Encounter (Signed)
Called pt back LM Direct to me when calls back about Twinrix vaccinations I am assuming?  TMS

## 2018-05-02 ENCOUNTER — Ambulatory Visit: Payer: Medicare Other | Admitting: Internal Medicine

## 2018-05-03 ENCOUNTER — Other Ambulatory Visit: Payer: Self-pay | Admitting: Internal Medicine

## 2018-05-03 DIAGNOSIS — E119 Type 2 diabetes mellitus without complications: Secondary | ICD-10-CM

## 2018-05-03 DIAGNOSIS — I10 Essential (primary) hypertension: Secondary | ICD-10-CM

## 2018-05-03 DIAGNOSIS — K219 Gastro-esophageal reflux disease without esophagitis: Secondary | ICD-10-CM

## 2018-05-03 DIAGNOSIS — G629 Polyneuropathy, unspecified: Secondary | ICD-10-CM

## 2018-05-03 MED ORDER — LISINOPRIL 20 MG PO TABS: 20.0000 mg | ORAL_TABLET | Freq: Every day | ORAL | 3 refills | Status: AC

## 2018-05-03 MED ORDER — GABAPENTIN 100 MG PO CAPS: 100.0000 mg | ORAL_CAPSULE | Freq: Two times a day (BID) | ORAL | 3 refills | Status: AC

## 2018-05-03 MED ORDER — CHLORTHALIDONE 25 MG PO TABS: 25.0000 mg | ORAL_TABLET | Freq: Every day | ORAL | 3 refills | Status: DC

## 2018-05-03 MED ORDER — PANTOPRAZOLE SODIUM 40 MG PO TBEC: 40.0000 mg | DELAYED_RELEASE_TABLET | Freq: Every day | ORAL | 3 refills | Status: AC

## 2018-05-03 MED ORDER — SIMVASTATIN 20 MG PO TABS: 20.0000 mg | ORAL_TABLET | Freq: Every day | ORAL | 3 refills | Status: DC

## 2018-05-03 MED ORDER — JANUVIA 100 MG PO TABS: 100.0000 mg | ORAL_TABLET | Freq: Every day | ORAL | 3 refills | Status: AC

## 2018-05-03 MED ORDER — AMLODIPINE BESYLATE 5 MG PO TABS: 5.0000 mg | ORAL_TABLET | Freq: Every day | ORAL | 3 refills | Status: AC

## 2018-05-05 ENCOUNTER — Ambulatory Visit (INDEPENDENT_AMBULATORY_CARE_PROVIDER_SITE_OTHER): Payer: Medicare Other | Admitting: Internal Medicine

## 2018-05-05 ENCOUNTER — Encounter: Payer: Self-pay | Admitting: Internal Medicine

## 2018-05-05 VITALS — BP 118/76 | HR 71 | Temp 98.5°F | Resp 16 | Ht 61.0 in | Wt 222.6 lb

## 2018-05-05 DIAGNOSIS — F329 Major depressive disorder, single episode, unspecified: Secondary | ICD-10-CM

## 2018-05-05 DIAGNOSIS — F32A Depression, unspecified: Secondary | ICD-10-CM

## 2018-05-05 DIAGNOSIS — F339 Major depressive disorder, recurrent, unspecified: Secondary | ICD-10-CM | POA: Diagnosis not present

## 2018-05-05 DIAGNOSIS — G4700 Insomnia, unspecified: Secondary | ICD-10-CM | POA: Diagnosis not present

## 2018-05-05 MED ORDER — TRAZODONE HCL 50 MG PO TABS: 25.0000 mg | ORAL_TABLET | Freq: Every day | ORAL | 3 refills | Status: DC

## 2018-05-05 NOTE — Patient Instructions (Addendum)
I ordered IR consultation to check G Tube placement  Increase lantus to 14 units and do sliding scale 15 minutes before supplement     Take 1/2 pill at night every night Trazadone if not working increase dose to 50 mg  L theanine supplement for sleep and anxiety   F/u in 6 weeks    Major Depressive Disorder, Adult Major depressive disorder (MDD) is a mental health condition. MDD often makes you feel sad, hopeless, or helpless. MDD can also cause symptoms in your body. MDD can affect your:  Work.  School.  Relationships.  Other normal activities.  MDD can range from mild to very bad. It may occur once (single episode MDD). It can also occur many times (recurrent MDD). The main symptoms of MDD often include:  Feeling sad, depressed, or irritable most of the time.  Loss of interest.  MDD symptoms also include:  Sleeping too much or too little.  Eating too much or too little.  A change in your weight.  Feeling tired (fatigue) or having low energy.  Feeling worthless.  Feeling guilty.  Trouble making decisions.  Trouble thinking clearly.  Thoughts of suicide or harming others.  Feeling weak.  Feeling agitated.  Keeping yourself from being around other people (isolation).  Follow these instructions at home: Activity  Do these things as told by your doctor: ? Go back to your normal activities. ? Exercise regularly. ? Spend time outdoors. Alcohol  Talk with your doctor about how alcohol can affect your antidepressant medicines.  Do not drink alcohol. Or, limit how much alcohol you drink. ? This means no more than 1 drink a day for nonpregnant women and 2 drinks a day for men. One drink equals one of these:  12 oz of beer.  5 oz of wine.  1 oz of hard liquor. General instructions  Take over-the-counter and prescription medicines only as told by your doctor.  Eat a healthy diet.  Get plenty of sleep.  Find activities that you enjoy. Make time  to do them.  Think about joining a support group. Your doctor may be able to suggest a group for you.  Keep all follow-up visits as told by your doctor. This is important. Where to find more information:  [employer] on Mental Illness: ? www.nami.org  [place] of Mental Health: ? http://www.maynard.net/  National Suicide Prevention Lifeline: ? 719-186-7401. This is free, 24-hour help. Contact a doctor if:  Your symptoms get worse.  You have new symptoms. Get help right away if:  You self-harm.  You see, hear, taste, smell, or feel things that are not present (hallucinate). If you ever feel like you may hurt yourself or others, or have thoughts about taking your own life, get help right away. You can go to your nearest emergency department or call:  Your local emergency services (911 in the [place]  A suicide crisis helpline, such as the National Suicide Prevention Lifeline: ? 863 265 5530. This is open 24 hours a day.  This information is not intended to replace advice given to you by your health care provider. Make sure you discuss any questions you have with your health care provider. Document Released: 09/01/2015 Document Revised: 06/06/2016 Document Reviewed: 06/06/2016 Elsevier Interactive Patient Education  2017 Elsevier Inc.   Trazodone tablets What is this medicine? TRAZODONE (TRAZ oh done) is used to treat depression. This medicine may be used for other purposes; ask your health care provider or pharmacist if you have questions. COMMON  BRAND NAME(S): Desyrel What should I tell my health care provider before I take this medicine? They need to know if you have any of these conditions: -attempted suicide or thinking about it -bipolar disorder -bleeding problems -glaucoma -heart disease, or previous heart attack -irregular heart beat -kidney or liver disease -low levels of sodium in the blood -an unusual or allergic reaction to trazodone,  other medicines, foods, dyes or preservatives -pregnant or trying to get pregnant -breast-feeding How should I use this medicine? Take this medicine by mouth with a glass of water. Follow the directions on the prescription label. Take this medicine shortly after a meal or a light snack. Take your medicine at regular intervals. Do not take your medicine more often than directed. Do not stop taking this medicine suddenly except upon the advice of your doctor. Stopping this medicine too quickly may cause serious side effects or your condition may worsen. A special MedGuide will be given to you by the pharmacist with each prescription and refill. Be sure to read this information carefully each time. Talk to your pediatrician regarding the use of this medicine in children. Special care may be needed. Overdosage: If you think you have taken too much of this medicine contact a poison control center or emergency room at once. NOTE: This medicine is only for you. Do not share this medicine with others. What if I miss a dose? If you miss a dose, take it as soon as you can. If it is almost time for your next dose, take only that dose. Do not take double or extra doses. What may interact with this medicine? Do not take this medicine with any of the following medications: -certain medicines for fungal infections like fluconazole, itraconazole, ketoconazole, posaconazole, voriconazole -cisapride -dofetilide -dronedarone -linezolid -MAOIs like Carbex, Eldepryl, Marplan, Nardil, and Parnate -mesoridazine -methylene blue (injected into a vein) -pimozide -saquinavir -thioridazine -ziprasidone This medicine may also interact with the following medications: -alcohol -antiviral medicines for HIV or AIDS -aspirin and aspirin-like medicines -barbiturates like phenobarbital -certain medicines for blood pressure, heart disease, irregular heart beat -certain medicines for depression, anxiety, or psychotic  disturbances -certain medicines for migraine headache like almotriptan, eletriptan, frovatriptan, naratriptan, rizatriptan, sumatriptan, zolmitriptan -certain medicines for seizures like carbamazepine and phenytoin -certain medicines for sleep -certain medicines that treat or prevent blood clots like dalteparin, enoxaparin, warfarin -digoxin -fentanyl -lithium -NSAIDS, medicines for pain and inflammation, like ibuprofen or naproxen -other medicines that prolong the QT interval (cause an abnormal heart rhythm) -rasagiline -supplements like St. John's wort, kava kava, valerian -tramadol -tryptophan This list may not describe all possible interactions. Give your health care provider a list of all the medicines, herbs, non-prescription drugs, or dietary supplements you use. Also tell them if you smoke, drink alcohol, or use illegal drugs. Some items may interact with your medicine. What should I watch for while using this medicine? Tell your doctor if your symptoms do not get better or if they get worse. Visit your doctor or health care professional for regular checks on your progress. Because it may take several weeks to see the full effects of this medicine, it is important to continue your treatment as prescribed by your doctor. Patients and their families should watch out for new or worsening thoughts of suicide or depression. Also watch out for sudden changes in feelings such as feeling anxious, agitated, panicky, irritable, hostile, aggressive, impulsive, severely restless, overly excited and hyperactive, or not being able to sleep. If this happens, especially at the  beginning of treatment or after a change in dose, call your health care professional. Bonita Quin may get drowsy or dizzy. Do not drive, use machinery, or do anything that needs mental alertness until you know how this medicine affects you. Do not stand or sit up quickly, especially if you are an older patient. This reduces the risk of dizzy  or fainting spells. Alcohol may interfere with the effect of this medicine. Avoid alcoholic drinks. This medicine may cause dry eyes and blurred vision. If you wear contact lenses you may feel some discomfort. Lubricating drops may help. See your eye doctor if the problem does not go away or is severe. Your mouth may get dry. Chewing sugarless gum, sucking hard candy and drinking plenty of water may help. Contact your doctor if the problem does not go away or is severe. What side effects may I notice from receiving this medicine? Side effects that you should report to your doctor or health care professional as soon as possible: -allergic reactions like skin rash, itching or hives, swelling of the face, lips, or tongue -elevated mood, decreased need for sleep, racing thoughts, impulsive behavior -confusion -fast, irregular heartbeat -feeling faint or lightheaded, falls -feeling agitated, angry, or irritable -loss of balance or coordination -painful or prolonged erections -restlessness, pacing, inability to keep still -suicidal thoughts or other mood changes -tremors -trouble sleeping -seizures -unusual bleeding or bruising Side effects that usually do not require medical attention (report to your doctor or health care professional if they continue or are bothersome): -change in sex drive or performance -change in appetite or weight -constipation -headache -muscle aches or pains -nausea This list may not describe all possible side effects. Call your doctor for medical advice about side effects. You may report side effects to FDA at 1-800-FDA-1088. Where should I keep my medicine? Keep out of the reach of children. Store at room temperature between 15 and 30 degrees C (59 to 86 degrees F). Protect from light. Keep container tightly closed. Throw away any unused medicine after the expiration date. NOTE: This sheet is a summary. It may not cover all possible information. If you have  questions about this medicine, talk to your doctor, pharmacist, or health care provider.  2018 Elsevier/Gold Standard (2016-02-19 16:57:05)

## 2018-05-05 NOTE — Progress Notes (Signed)
Chief Complaint  Patient presents with  . Acute Visit    depression   Depression recurrent due to stress with family son is alcoholic and calls her and his wife calls b/c he gets violent and also stressors of taking care of sick husband with cancer. Her 1st husband had cancer and they were married x 30+ years and taking care of her current husband is stressful and takes her back to the times of taking care of 1st husband. She cant remember name of medication tried 30+ years ago. Also having trouble with sleep though taking melatonin. She is trying self care and has tried therapy in the past and has current resources at the cancer center.  She has lost social interest and doing things she likes to do   Review of Systems  Constitutional: Positive for weight loss.  Respiratory: Negative for shortness of breath.   Cardiovascular: Negative for chest pain.  Gastrointestinal: Negative for abdominal pain.  Psychiatric/Behavioral: Positive for depression. The patient has insomnia.    Past Medical History:  Diagnosis Date  . Allergy   . Arthritis    hips s/p left hip prosthesis x 3, low back sees [facility] Dr. Joneen Caraway [place]  . Diabetes mellitus without complication (HCC)   . Hypertension   . Low back pain   . Shingles    2018   Past Surgical History:  Procedure Laterality Date  . JOINT REPLACEMENT     left hip x 3   Family History  Problem Relation Age of Onset  . Early death Mother   . Hypertension Mother   . Cancer Father        spine ?MM  . Heart disease Paternal Grandmother   . Tuberculosis Paternal Grandfather   . Arthritis Cousin    Social History   Socioeconomic History  . Marital status: Married    Spouse name: Not on file  . Number of children: Not on file  . Years of education: Not on file  . Highest education level: Not on file  Occupational History  . Not on file  Social Needs  . Financial resource strain: Not on file  . Food insecurity:     Worry: Not on file    Inability: Not on file  . Transportation needs:    Medical: Not on file    Non-medical: Not on file  Tobacco Use  . Smoking status: Never Smoker  . Smokeless tobacco: Never Used  Substance and Sexual Activity  . Alcohol use: No  . Drug use: No  . Sexual activity: Never  Lifestyle  . Physical activity:    Days per week: Not on file    Minutes per session: Not on file  . Stress: Not on file  Relationships  . Social connections:    Talks on phone: Not on file    Gets together: Not on file    Attends religious service: Not on file    Active member of club or organization: Not on file    Attends meetings of clubs or organizations: Not on file    Relationship status: Not on file  . Intimate partner violence:    Fear of current or ex partner: Not on file    Emotionally abused: Not on file    Physically abused: Not on file    Forced sexual activity: Not on file  Other Topics Concern  . 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 amLODipine (NORVASC) 5 MG tablet Take 1 tablet (5 mg total) by mouth daily.  Marland Kitchen aspirin 325 MG tablet aspirin 325 mg tablet,delayed release  TAKE 1 TABLET BY MOUTH TWICE A DAY FOR 6 WEEKS AFTER SURGERY TO DECREASE RISK OF CLOTS  . chlorthalidone (HYGROTON) 25 MG tablet Take 1 tablet (25 mg total) by mouth daily.  . fluticasone (FLONASE) 50 MCG/ACT nasal spray Place into the nose.  . gabapentin (NEURONTIN) 100 MG capsule Take 1 capsule (100 mg total) by mouth 2 (two) times daily.  Marland Kitchen JANUVIA 100 MG tablet Take 1 tablet (100 mg total) by mouth daily.  Marland Kitchen lisinopril (PRINIVIL,ZESTRIL) 20 MG tablet Take 1 tablet (20 mg total) by mouth daily.  . meloxicam (MOBIC) 15 MG tablet Take 1 tablet (15 mg total) by mouth daily.  . pantoprazole (PROTONIX) 40 MG  tablet Take 1 tablet (40 mg total) by mouth daily. 30 minutes b/f food  . simvastatin (ZOCOR) 20 MG tablet Take 1 tablet (20 mg total) by mouth daily at 6 PM.   Allergies  Allergen Reactions  . Sulfa Antibiotics Hives and Dermatitis   Recent Results (from the past 2160 hour(s))  Comprehensive metabolic panel     Status: Abnormal   Collection Time: 04/13/18 11:03 AM  Result Value Ref Range   Sodium 142 135 - 145 mEq/L   Potassium 3.7 3.5 - 5.1 mEq/L   Chloride 105 96 - 112 mEq/L   CO2 29 19 - 32 mEq/L   Glucose, Bld 102 (H) 70 - 99 mg/dL   BUN 21 6 - 23 mg/dL   Creatinine, Ser 4.09 0.40 - 1.20 mg/dL   Total Bilirubin 0.7 0.2 - 1.2 mg/dL   Alkaline Phosphatase 55 39 - 117 U/L   AST 21 0 - 37 U/L   ALT 18 0 - 35 U/L   Total Protein 7.6 6.0 - 8.3 g/dL   Albumin 4.3 3.5 - 5.2 g/dL   Calcium 9.6 8.4 - 81.1 mg/dL   GFR 91.47 >82.95 mL/min  CBC with Differential/Platelet     Status: Abnormal   Collection Time: 04/13/18 11:03 AM  Result Value Ref Range   WBC 4.9 4.0 - 10.5 K/uL   RBC 4.78 3.87 - 5.11 Mil/uL   Hemoglobin 14.1 12.0 - 15.0 g/dL   HCT 62.1 30.8 - 65.7 %   MCV 86.9 78.0 - 100.0 fl   MCHC 34.0 30.0 - 36.0 g/dL   RDW 84.6 96.2 - 95.2 %   Platelets 269.0 150.0 - 400.0 K/uL   Neutrophils Relative % 47.3 43.0 - 77.0 %   Lymphocytes Relative 36.1 12.0 - 46.0 %   Monocytes Relative 10.2 3.0 - 12.0 %   Eosinophils Relative 5.2 (H) 0.0 - 5.0 %   Basophils Relative 1.2 0.0 - 3.0 %   Neutro Abs 2.3 1.4 - 7.7 K/uL   Lymphs Abs 1.8 0.7 - 4.0 K/uL   Monocytes Absolute 0.5 0.1 - 1.0 K/uL   Eosinophils Absolute 0.3 0.0 - 0.7 K/uL   Basophils Absolute 0.1 0.0 - 0.1 K/uL  Lipid panel     Status: None   Collection Time: 04/13/18 11:03 AM  Result Value Ref Range   Cholesterol 142 0 - 200 mg/dL    Comment:  ATP III Classification       Desirable:  < 200 mg/dL               Borderline High:  200 - 239 mg/dL          High:  > = 161 mg/dL   Triglycerides 09.6 0.0 - 149.0 mg/dL     Comment: Normal:  <045 mg/dLBorderline High:  150 - 199 mg/dL   HDL 40.98 >11.91 mg/dL   VLDL 47.8 0.0 - 29.5 mg/dL   LDL Cholesterol 70 0 - 99 mg/dL   Total CHOL/HDL Ratio 2     Comment:                Men          Women1/2 Average Risk     3.4          3.3Average Risk          5.0          4.42X Average Risk          9.6          7.13X Average Risk          15.0          11.0                       NonHDL 81.70     Comment: NOTE:  Non-HDL goal should be 30 mg/dL higher than patient's LDL goal (i.e. LDL goal of < 70 mg/dL, would have non-HDL goal of < 100 mg/dL)  Hemoglobin A2Z     Status: None   Collection Time: 04/13/18 11:03 AM  Result Value Ref Range   Hgb A1c MFr Bld 6.1 4.6 - 6.5 %    Comment: Glycemic Control Guidelines for People with Diabetes:Non Diabetic:  <6%Goal of Therapy: <7%Additional Action Suggested:  >8%   TSH     Status: None   Collection Time: 04/13/18 11:03 AM  Result Value Ref Range   TSH 0.60 0.35 - 4.50 uIU/mL  Hepatitis B surface antibody     Status: Abnormal   Collection Time: 04/13/18 11:03 AM  Result Value Ref Range   Hepatitis B-Post <5 (L) > OR = 10 mIU/mL    Comment: . Patient does not have immunity to hepatitis B virus. . For additional information, please refer to http://education.[facility].com/faq/FAQ105 (This link is being provided for informational/ educational purposes only).   Hepatitis C antibody     Status: None   Collection Time: 04/13/18 11:03 AM  Result Value Ref Range   Hepatitis C Ab NON-REACTIVE NON-REACTI   SIGNAL TO CUT-OFF 0.01 <1.00    Comment: . HCV antibody was non-reactive. There is no laboratory  evidence of HCV infection. . In most cases, no further action is required. However, if recent HCV exposure is suspected, a test for HCV RNA (test code 30865) is suggested. . For additional information please refer to http://education.[facility].com/faq/FAQ22v1 (This link is being provided for  informational/ educational purposes only.) .   Hepatitis A Ab, Total     Status: None   Collection Time: 04/13/18 11:03 AM  Result Value Ref Range   Hepatitis A AB,Total NON-REACTIVE NON-REACTI  Hepatitis A antibody, IgM     Status: None   Collection Time: 04/13/18 11:03 AM  Result Value Ref Range   Hep A IgM NON-REACTIVE NON-REACTI  Vitamin D (25 hydroxy)     Status: None   Collection Time: 04/13/18 11:03 AM  Result Value Ref Range   VITD 35.84 30.00 - 100.00 ng/mL  Hepatitis B surface antigen     Status: None   Collection Time: 04/13/18 11:03 AM  Result Value Ref Range   Hepatitis B Surface Ag NON-REACTIVE NON-REACTI  Urine Microalbumin w/creat. ratio     Status: None   Collection Time: 04/13/18 11:03 AM  Result Value Ref Range   Creatinine, Urine 180.3 Not Estab. mg/dL   Microalbumin, Urine 6.7 Not Estab. ug/mL   Microalb/Creat Ratio 3.7 0.0 - 30.0 mg/g creat    Comment:                      Normal:                0.0 -  30.0                      Albuminuria:          31.0 - 300.0                      Clinical albuminuria:       >300.0   Urinalysis, Routine w reflex microscopic     Status: Abnormal   Collection Time: 04/13/18 11:03 AM  Result Value Ref Range   Specific Gravity, UA 1.027 1.005 - 1.030   pH, UA 6.0 5.0 - 7.5   Color, UA Yellow Yellow   Appearance Ur Clear Clear   Leukocytes, UA Trace (A) Negative   Protein, UA Negative Negative/Trace   Glucose, UA Negative Negative   Ketones, UA Trace (A) Negative   RBC, UA Negative Negative   Bilirubin, UA Negative Negative   Urobilinogen, Ur 1.0 0.2 - 1.0 mg/dL   Nitrite, UA Negative Negative   Microscopic Examination See below:     Comment: Microscopic was indicated and was performed.  Microscopic Examination     Status: Abnormal   Collection Time: 04/13/18 11:03 AM  Result Value Ref Range   WBC, UA 6-10 (A) 0 - 5 /hpf   RBC, UA 0-2 0 - 2 /hpf   Epithelial Cells (non renal) 0-10 0 - 10 /hpf   Casts None seen  None seen /lpf   Mucus, UA Present Not Estab.   Bacteria, UA None seen None seen/Few   Objective  Body mass index is 42.06 kg/m. Wt Readings from Last 3 Encounters:  05/05/18 222 lb 9.6 oz (101 kg)  03/09/18 224 lb 6.4 oz (101.8 kg)  01/12/18 223 lb (101.2 kg)   Temp Readings from Last 3 Encounters:  05/05/18 98.5 F (36.9 C) (Oral)  03/09/18 98.3 F (36.8 C) (Oral)  01/12/18 98.6 F (37 C) (Oral)   BP Readings from Last 3 Encounters:  05/05/18 118/76  03/09/18 110/76  01/12/18 116/68   Pulse Readings from Last 3 Encounters:  05/05/18 71  03/09/18 87  01/12/18 69    Physical Exam  Constitutional: She is oriented to person, place, and time. Vital signs are normal. She appears well-developed and well-nourished. She is cooperative.  HENT:  Head: Normocephalic and atraumatic.  Mouth/Throat: Oropharynx is clear and moist and mucous membranes are normal.  Eyes: Pupils are equal, round, and reactive to light. Conjunctivae are normal.  Cardiovascular: Normal rate, regular rhythm and normal heart sounds.  Pulmonary/Chest: Effort normal and breath sounds normal.  Neurological: She is alert and oriented to person, place, and time. Gait normal.  Skin: Skin is warm, dry and intact.  Psychiatric: She has a normal mood and affect. Her speech is normal and behavior is normal. Judgment and thought content normal. Cognition and memory are normal.  Tearful on exam  Nursing note and vitals reviewed.   Assessment   1. Depression/insomnia  Plan   1. Add trazadone 25 mg qhs to see if helps f/u in 6 weeks   phq 9 score 14 today  If not improved consider inc to 50 at f/u vs d/c and change SSRI vs SNRI    Provider: Dr. French Ana McLean-Scocuzza-Internal Medicine

## 2018-05-30 ENCOUNTER — Ambulatory Visit (INDEPENDENT_AMBULATORY_CARE_PROVIDER_SITE_OTHER): Payer: Medicare Other | Admitting: *Deleted

## 2018-05-30 DIAGNOSIS — Z23 Encounter for immunization: Secondary | ICD-10-CM | POA: Diagnosis not present

## 2018-06-14 ENCOUNTER — Ambulatory Visit: Payer: Medicare Other | Admitting: Internal Medicine

## 2018-06-15 ENCOUNTER — Encounter: Payer: Self-pay | Admitting: Podiatry

## 2018-06-15 ENCOUNTER — Ambulatory Visit (INDEPENDENT_AMBULATORY_CARE_PROVIDER_SITE_OTHER): Payer: Medicare Other | Admitting: Podiatry

## 2018-06-15 DIAGNOSIS — B351 Tinea unguium: Secondary | ICD-10-CM | POA: Diagnosis not present

## 2018-06-15 DIAGNOSIS — M79609 Pain in unspecified limb: Secondary | ICD-10-CM

## 2018-06-15 DIAGNOSIS — E119 Type 2 diabetes mellitus without complications: Secondary | ICD-10-CM

## 2018-06-15 NOTE — Progress Notes (Signed)
Complaint:  Visit Type: Patient returns to my office for continued preventative foot care services. Complaint: Patient states" my nails have grown long and thick and become painful to walk and wear shoes" Patient has been diagnosed with DM with no foot complications. The patient presents for preventative foot care services. No changes to ROS.  She has return of painful porokeratosis  Left foot.  Podiatric Exam: Vascular: dorsalis pedis and posterior tibial pulses are palpable bilateral. Capillary return is immediate. Temperature gradient is WNL. Skin turgor WNL  Sensorium: Normal Semmes Weinstein monofilament test. Normal tactile sensation bilaterally. Nail Exam: Pt has thick disfigured discolored nails with subungual debris noted bilateral entire nail hallux through fifth toenails Ulcer Exam: There is no evidence of ulcer or pre-ulcerative changes or infection. Orthopedic Exam: Muscle tone and strength are WNL. No limitations in general ROM. No crepitus or effusions noted. HAV  B/L. Skin: No Porokeratosis. No infection or ulcers  Diagnosis:  Onychomycosis, , Pain in right toe, pain in left toes,  Porokeratosis  Treatment & Plan Procedures and Treatment: Consent by patient was obtained for treatment procedures. The patient understood the discussion of treatment and procedures well. All questions were answered thoroughly reviewed. Debridement of mycotic and hypertrophic toenails, 1 through 5 bilateral and clearing of subungual debris. No ulceration, no infection noted..   Return Visit-Office Procedure: Patient instructed to return to the office for a follow up visit prn   for continued evaluation and treatment.    Helane Gunther DPM

## 2018-06-26 ENCOUNTER — Telehealth: Payer: Self-pay | Admitting: Internal Medicine

## 2018-06-26 NOTE — Telephone Encounter (Signed)
Copied from CRM [id number]. Topic: Quick Communication - See Telephone Encounter >> Jun 26, 2018  4:30 PM Trula Slade wrote: CRM for notification. See Telephone encounter for: 06/26/18. Patient is taking an 1/2 tablet of her traZODone (DESYREL) 50 MG tablet medication and she wants to know if it would be okay to take one whole tablet, because the 1/2 tablet is not helping much.

## 2018-06-26 NOTE — Telephone Encounter (Signed)
rx change  

## 2018-06-27 ENCOUNTER — Other Ambulatory Visit: Payer: Self-pay | Admitting: Internal Medicine

## 2018-06-27 DIAGNOSIS — F329 Major depressive disorder, single episode, unspecified: Secondary | ICD-10-CM

## 2018-06-27 DIAGNOSIS — G4700 Insomnia, unspecified: Secondary | ICD-10-CM

## 2018-06-27 DIAGNOSIS — F32A Depression, unspecified: Secondary | ICD-10-CM

## 2018-06-27 MED ORDER — TRAZODONE HCL 50 MG PO TABS: 50.0000 mg | ORAL_TABLET | Freq: Every day | ORAL | 0 refills | Status: DC

## 2018-06-27 NOTE — Telephone Encounter (Signed)
She can try 50-100 mg at night try 50 mg 1pill 1st  Sent [facility]   TMS

## 2018-06-30 NOTE — Telephone Encounter (Signed)
Patient was informed at husband appointment today.

## 2018-07-07 ENCOUNTER — Ambulatory Visit (INDEPENDENT_AMBULATORY_CARE_PROVIDER_SITE_OTHER): Payer: Medicare Other

## 2018-07-07 VITALS — BP 118/70 | HR 67 | Temp 98.3°F | Resp 15 | Ht 61.5 in | Wt 224.8 lb

## 2018-07-07 DIAGNOSIS — Z0000 Encounter for general adult medical examination without abnormal findings: Secondary | ICD-10-CM

## 2018-07-07 NOTE — Progress Notes (Signed)
Subjective:   Jennifer Tanner is a 72 y.o. female who presents for an Initial Medicare Annual Wellness Visit.  Review of Systems    No ROS.  Medicare Wellness Visit. Additional risk factors are reflected in the social history.  Cardiac Risk Factors include: advanced age (>22men, >52 women);hypertension;diabetes mellitus;obesity (BMI >30kg/m2)     Objective:    Today's Vitals   07/07/18 1029  BP: 118/70  Pulse: 67  Resp: 15  Temp: 98.3 F (36.8 C)  TempSrc: Oral  SpO2: 98%  Weight: 224 lb 12.8 oz (102 kg)  Height: 5' 1.5" (1.562 m)   Body mass index is 41.79 kg/m.  Advanced Directives 07/07/2018 03/30/2015  Does Patient Have a Medical Advance Directive? Yes No  Type of Estate agent of [place];Living will -  Does patient want to make changes to medical advance directive? No - Patient declined -  Copy of Healthcare Power of Attorney in Chart? No - copy requested -  Would patient like information on creating a medical advance directive? - Yes - Educational materials given    Current Medications (verified) Outpatient Encounter Medications as of 07/07/2018  Medication Sig  . amLODipine (NORVASC) 5 MG tablet Take 1 tablet (5 mg total) by mouth daily.  Marland Kitchen aspirin 325 MG tablet aspirin 325 mg tablet,delayed release  TAKE 1 TABLET BY MOUTH TWICE A DAY FOR 6 WEEKS AFTER SURGERY TO DECREASE RISK OF CLOTS  . chlorthalidone (HYGROTON) 25 MG tablet Take 1 tablet (25 mg total) by mouth daily.  . fluticasone (FLONASE) 50 MCG/ACT nasal spray Place into the nose.  . gabapentin (NEURONTIN) 100 MG capsule Take 1 capsule (100 mg total) by mouth 2 (two) times daily.  Marland Kitchen JANUVIA 100 MG tablet Take 1 tablet (100 mg total) by mouth daily.  Marland Kitchen lisinopril (PRINIVIL,ZESTRIL) 20 MG tablet Take 1 tablet (20 mg total) by mouth daily.  . meloxicam (MOBIC) 15 MG tablet Take 1 tablet (15 mg total) by mouth daily.  . pantoprazole (PROTONIX) 40 MG tablet Take 1 tablet (40 mg  total) by mouth daily. 30 minutes b/f food  . simvastatin (ZOCOR) 20 MG tablet Take 1 tablet (20 mg total) by mouth daily at 6 PM.  . traZODone (DESYREL) 50 MG tablet Take 1-2 tablets (50-100 mg total) by mouth at bedtime.   No facility-administered encounter medications on file as of 07/07/2018.     Allergies (verified) Sulfa antibiotics   History: Past Medical History:  Diagnosis Date  . Allergy   . Arthritis    hips s/p left hip prosthesis x 3, low back sees [facility] Dr. Joneen Caraway [place]  . Depression   . Diabetes mellitus without complication (HCC)   . Hypertension   . Low back pain   . Shingles    2018   Past Surgical History:  Procedure Laterality Date  . JOINT REPLACEMENT     left hip x 3   Family History  Problem Relation Age of Onset  . Early death Mother   . Hypertension Mother   . Cancer Father        spine ?MM  . Heart disease Paternal Grandmother   . Tuberculosis Paternal Grandfather   . Arthritis Cousin    Social History   Socioeconomic History  . Marital status: Married    Spouse name: Not on file  . Number of children: Not on file  . Years of education: Not on file  . Highest education level: Not on file  Occupational History  . Not on file  Social Needs  . Financial resource strain: Not hard at all  . Food insecurity:    Worry: Never true    Inability: Never true  . Transportation needs:    Medical: No    Non-medical: No  Tobacco Use  . Smoking status: Never Smoker  . Smokeless tobacco: Never Used  Substance and Sexual Activity  . Alcohol use: No  . Drug use: No  . Sexual activity: Never  Lifestyle  . Physical activity:    Days per week: 0 days    Minutes per session: Not on file  . Stress: Not at all  Relationships  . Social connections:    Talks on phone: Not on file    Gets together: Not on file    Attends religious service: Not on file    Active member of club or organization: Not on file    Attends meetings  of clubs or organizations: Not on file    Relationship status: Married  Other Topics Concern  . 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 1st husband of 30+ years died of cancer    2 kids    She is the only child    Enjoys travel     Tobacco Counseling Counseling given: Not Answered   Clinical Intake:  Pre-visit preparation completed: Yes  Pain : No/denies pain     Nutritional Status: BMI > 30  Obese Diabetes: Yes(Followed by pcp)  How often do you need to have someone help you when you read instructions, pamphlets, or other written materials from your doctor or pharmacy?: 1 - Never  Interpreter Needed?: No      Activities of Daily Living In your present state of health, do you have any difficulty performing the following activities: 07/07/2018  Hearing? N  Vision? N  Difficulty concentrating or making decisions? N  Walking or climbing stairs? N  Dressing or bathing? N  Doing errands, shopping? N  Preparing Food and eating ? N  Using the Toilet? N  In the past six months, have you accidently leaked urine? N  Do you have problems with loss of bowel control? N  Managing your Medications? N  Managing your Finances? N  Housekeeping or managing your Housekeeping? N  Some recent data might be hidden     Immunizations and Health Maintenance Immunization History  Administered Date(s) Administered  . Hep A / Hep B 04/26/2018, 05/30/2018  . Influenza Split 07/05/2012  . Influenza, High Dose Seasonal PF 07/09/2013  . Influenza, Quadrivalent, Recombinant, Inj, Pf 08/02/2011  . Influenza-Unspecified 06/24/2018  . Pneumococcal Conjugate-13 08/22/2014  . Pneumococcal Polysaccharide-23 06/08/2011  . Td 05/31/2011  . Tdap 05/31/2011  . Zoster Recombinat (Shingrix) 07/26/2017   There are no preventive care reminders to display for this patient.  Patient Care Team: McLean-Scocuzza, Pasty Spillers, MD as PCP -  General (Internal Medicine)  Indicate any recent Medical Services you may have received from other than [facility] providers in the past year (date may be approximate).     Assessment:   This is a routine wellness examination for Jennifer Tanner.  The goal of the wellness visit is to assist the patient how to close the gaps in care and create a preventative care plan for the patient.   States she feels relatively good over all. Recently added trazodone is working ok for sleep. 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 No acute issues to address with pcp today.    Osteoporosis risk reviewed.    Safety issues reviewed; Smoke and carbon monoxide detectors in the home. No firearms in the home. Wears seatbelts when driving or riding with others. No violence in the home.  They do not have excessive sun exposure.  Discussed the need for sun protection: hats, long sleeves and the use of sunscreen if there is significant sun exposure.  No new identified risk were noted.  No failures at ADL's or IADL's.    BMI- discussed the importance of a healthy diet, water intake and the benefits of aerobic exercise. Educational material provided.   Dental- every 12 months.  Eye- Visual acuity not assessed per patient preference since they have regular follow up with the ophthalmologist.   Sleep patterns- Sleeps ok at night.   Health maintenance gaps- closed.  Patient Concerns: None at this time. Follow up with PCP as needed.  Hearing/Vision screen Hearing Screening Comments: Patient is able to hear conversational tones without difficulty.  No issues reported.   Vision Screening Comments: Followed by [facility] Last OV 2019 Visual acuity not assessed per patient preference since they have regular follow up with the ophthalmologist  Dietary issues and exercise activities discussed: Current Exercise Habits: The patient does not participate in regular exercise at present  Goals      Patient Stated   . Lose Weight  (pt-stated)      Depression Screen PHQ 2/9 Scores 07/07/2018 05/05/2018 01/12/2018 11/03/2017  PHQ - 2 Score 2 5 2  0  PHQ- 9 Score 3 14 5  -    Fall Risk Fall Risk  07/07/2018 01/12/2018 11/03/2017  Falls in the past year? No No No   Cognitive Function:     6CIT Screen 07/07/2018  What Year? 0 points  What month? 0 points  What time? 0 points  Count back from 20 0 points  Months in reverse 0 points  Repeat phrase 0 points  Total Score 0    Screening Tests Health Maintenance  Topic Date Due  . HEMOGLOBIN A1C  10/14/2018  . OPHTHALMOLOGY EXAM  05/17/2019  . FOOT EXAM  06/24/2019  . MAMMOGRAM  07/29/2019  . Fecal DNA (Cologuard)  11/14/2020  . TETANUS/TDAP  05/30/2021  . INFLUENZA VACCINE  Completed  . DEXA SCAN  Completed  . Hepatitis C Screening  Completed  . PNA vac Low Risk Adult  Completed     Plan:   End of life planning; Advance aging; Advanced directives discussed. Copy of current HCPOA/Living Will requested.    I have personally reviewed and noted the following in the patient's chart:   . Medical and social history . Use of alcohol, tobacco or illicit drugs  . Current medications and supplements . Functional ability and status . Nutritional status . Physical activity . Advanced directives . List of other physicians . Hospitalizations, surgeries, and ER visits in previous 12 months . Vitals . Screenings to include cognitive, depression, and falls . Referrals and appointments  In addition, I have reviewed and discussed with patient certain preventive protocols, quality metrics, and best practice recommendations. A written personalized care plan for preventive services as well as general preventive health recommendations were provided to patient.     01/12/2021, LPN   06/01/2021

## 2018-07-07 NOTE — Progress Notes (Signed)
Agree with note   Dr. TMS 

## 2018-07-07 NOTE — Patient Instructions (Addendum)
  Ms. Vilma Meckel , Thank you for taking time to come for your Medicare Wellness Visit. I appreciate your ongoing commitment to your health goals. Please review the following plan we discussed and let me know if I can assist you in the future.   Follow up as needed.    Bring a copy of your Health Care Power of Attorney and/or Living Will to be scanned into chart.  Have a great day!  These are the goals we discussed: Goals      Patient Stated   . Lose Weight (pt-stated)       This is a list of the screening recommended for you and due dates:  Health Maintenance  Topic Date Due  . Hemoglobin A1C  10/14/2018  . Eye exam for diabetics  05/17/2019  . Complete foot exam   06/24/2019  . Mammogram  07/29/2019  . Cologuard (Stool DNA test)  11/14/2020  . Tetanus Vaccine  05/30/2021  . Flu Shot  Completed  . DEXA scan (bone density measurement)  Completed  .  Hepatitis C: One time screening is recommended by Center for Disease Control  (CDC) for  adults born from [birth year] through 1965.   Completed  . Pneumonia vaccines  Completed

## 2018-07-26 ENCOUNTER — Other Ambulatory Visit: Payer: Self-pay | Admitting: Internal Medicine

## 2018-07-26 DIAGNOSIS — Z1231 Encounter for screening mammogram for malignant neoplasm of breast: Secondary | ICD-10-CM

## 2018-08-11 DIAGNOSIS — L300 Nummular dermatitis: Secondary | ICD-10-CM | POA: Diagnosis not present

## 2018-08-11 DIAGNOSIS — L239 Allergic contact dermatitis, unspecified cause: Secondary | ICD-10-CM | POA: Diagnosis not present

## 2018-08-22 DIAGNOSIS — R9431 Abnormal electrocardiogram [ECG] [EKG]: Secondary | ICD-10-CM | POA: Diagnosis not present

## 2018-08-22 DIAGNOSIS — E113212 Type 2 diabetes mellitus with mild nonproliferative diabetic retinopathy with macular edema, left eye: Secondary | ICD-10-CM | POA: Diagnosis not present

## 2018-08-22 DIAGNOSIS — R079 Chest pain, unspecified: Secondary | ICD-10-CM | POA: Diagnosis not present

## 2018-08-22 DIAGNOSIS — I10 Essential (primary) hypertension: Secondary | ICD-10-CM | POA: Diagnosis not present

## 2018-08-22 DIAGNOSIS — I2510 Atherosclerotic heart disease of native coronary artery without angina pectoris: Secondary | ICD-10-CM | POA: Diagnosis not present

## 2018-08-22 DIAGNOSIS — I351 Nonrheumatic aortic (valve) insufficiency: Secondary | ICD-10-CM | POA: Diagnosis not present

## 2018-08-22 DIAGNOSIS — I340 Nonrheumatic mitral (valve) insufficiency: Secondary | ICD-10-CM | POA: Diagnosis not present

## 2018-08-22 DIAGNOSIS — R0602 Shortness of breath: Secondary | ICD-10-CM | POA: Diagnosis not present

## 2018-09-04 ENCOUNTER — Ambulatory Visit (INDEPENDENT_AMBULATORY_CARE_PROVIDER_SITE_OTHER): Payer: Medicare Other | Admitting: Podiatry

## 2018-09-04 ENCOUNTER — Encounter: Payer: Self-pay | Admitting: Podiatry

## 2018-09-04 DIAGNOSIS — B351 Tinea unguium: Secondary | ICD-10-CM | POA: Diagnosis not present

## 2018-09-04 DIAGNOSIS — M79609 Pain in unspecified limb: Secondary | ICD-10-CM | POA: Diagnosis not present

## 2018-09-04 DIAGNOSIS — E119 Type 2 diabetes mellitus without complications: Secondary | ICD-10-CM

## 2018-09-04 NOTE — Progress Notes (Signed)
Complaint:  Visit Type: Patient returns to my office for continued preventative foot care services. Complaint: Patient states" my nails have grown long and thick and become painful to walk and wear shoes" Patient has been diagnosed with DM with no foot complications. The patient presents for preventative foot care services. No changes to ROS.  She has return of painful porokeratosis  Left foot.  Podiatric Exam: Vascular: dorsalis pedis and posterior tibial pulses are palpable bilateral. Capillary return is immediate. Temperature gradient is WNL. Skin turgor WNL  Sensorium: Normal Semmes Weinstein monofilament test. Normal tactile sensation bilaterally. Nail Exam: Pt has thick disfigured discolored nails with subungual debris noted bilateral entire nail hallux through fifth toenails Ulcer Exam: There is no evidence of ulcer or pre-ulcerative changes or infection. Orthopedic Exam: Muscle tone and strength are WNL. No limitations in general ROM. No crepitus or effusions noted. HAV  B/L. Skin: No Porokeratosis. No infection or ulcers  Diagnosis:  Onychomycosis, , Pain in right toe, pain in left toes,  Porokeratosis  Treatment & Plan Procedures and Treatment: Consent by patient was obtained for treatment procedures. The patient understood the discussion of treatment and procedures well. All questions were answered thoroughly reviewed. Debridement of mycotic and hypertrophic toenails, 1 through 5 bilateral and clearing of subungual debris. No ulceration, no infection noted..   Return Visit-Office Procedure: Patient instructed to return to the office for a follow up visit 4 months   for continued evaluation and treatment.    Helane Gunther DPM

## 2018-09-13 DIAGNOSIS — R079 Chest pain, unspecified: Secondary | ICD-10-CM | POA: Diagnosis not present

## 2018-09-15 DIAGNOSIS — L738 Other specified follicular disorders: Secondary | ICD-10-CM | POA: Diagnosis not present

## 2018-09-15 DIAGNOSIS — L308 Other specified dermatitis: Secondary | ICD-10-CM | POA: Diagnosis not present

## 2018-09-20 DIAGNOSIS — E785 Hyperlipidemia, unspecified: Secondary | ICD-10-CM | POA: Diagnosis not present

## 2018-09-20 DIAGNOSIS — R9431 Abnormal electrocardiogram [ECG] [EKG]: Secondary | ICD-10-CM | POA: Diagnosis not present

## 2018-09-20 DIAGNOSIS — R0602 Shortness of breath: Secondary | ICD-10-CM | POA: Diagnosis not present

## 2018-09-20 DIAGNOSIS — R079 Chest pain, unspecified: Secondary | ICD-10-CM | POA: Diagnosis not present

## 2018-09-20 DIAGNOSIS — I25119 Atherosclerotic heart disease of native coronary artery with unspecified angina pectoris: Secondary | ICD-10-CM | POA: Diagnosis not present

## 2018-09-20 DIAGNOSIS — I351 Nonrheumatic aortic (valve) insufficiency: Secondary | ICD-10-CM | POA: Diagnosis not present

## 2018-09-20 DIAGNOSIS — I2510 Atherosclerotic heart disease of native coronary artery without angina pectoris: Secondary | ICD-10-CM | POA: Diagnosis not present

## 2018-09-20 DIAGNOSIS — I10 Essential (primary) hypertension: Secondary | ICD-10-CM | POA: Diagnosis not present

## 2018-09-20 DIAGNOSIS — E113212 Type 2 diabetes mellitus with mild nonproliferative diabetic retinopathy with macular edema, left eye: Secondary | ICD-10-CM | POA: Diagnosis not present

## 2018-09-20 DIAGNOSIS — I340 Nonrheumatic mitral (valve) insufficiency: Secondary | ICD-10-CM | POA: Diagnosis not present

## 2018-10-10 DIAGNOSIS — I25119 Atherosclerotic heart disease of native coronary artery with unspecified angina pectoris: Secondary | ICD-10-CM | POA: Diagnosis not present

## 2018-10-12 ENCOUNTER — Ambulatory Visit
Admission: RE | Admit: 2018-10-12 | Discharge: 2018-10-12 | Disposition: A | Discharge status: Home / Self Care | Payer: Medicare Other | Source: Ambulatory Visit | Attending: Internal Medicine | Admitting: Internal Medicine

## 2018-10-12 DIAGNOSIS — Z1231 Encounter for screening mammogram for malignant neoplasm of breast: Secondary | ICD-10-CM | POA: Insufficient documentation

## 2018-10-16 ENCOUNTER — Ambulatory Visit: Payer: Medicare Other | Admitting: Podiatry

## 2018-10-16 ENCOUNTER — Telehealth: Payer: Self-pay

## 2018-10-16 NOTE — Telephone Encounter (Signed)
Pt. Given mammogram results.Verbalizes understanding.

## 2018-10-24 DIAGNOSIS — I10 Essential (primary) hypertension: Secondary | ICD-10-CM | POA: Diagnosis not present

## 2018-10-24 DIAGNOSIS — I340 Nonrheumatic mitral (valve) insufficiency: Secondary | ICD-10-CM | POA: Diagnosis not present

## 2018-10-24 DIAGNOSIS — I351 Nonrheumatic aortic (valve) insufficiency: Secondary | ICD-10-CM | POA: Diagnosis not present

## 2018-10-24 DIAGNOSIS — R0602 Shortness of breath: Secondary | ICD-10-CM | POA: Diagnosis not present

## 2018-10-24 DIAGNOSIS — I2510 Atherosclerotic heart disease of native coronary artery without angina pectoris: Secondary | ICD-10-CM | POA: Diagnosis not present

## 2018-10-31 ENCOUNTER — Ambulatory Visit (INDEPENDENT_AMBULATORY_CARE_PROVIDER_SITE_OTHER): Payer: Medicare Other

## 2018-10-31 DIAGNOSIS — Z23 Encounter for immunization: Secondary | ICD-10-CM

## 2018-10-31 NOTE — Progress Notes (Signed)
Patient came in today for 3rd dose of hep A & B vaccine in RD. Patient tolerated well.

## 2018-11-22 ENCOUNTER — Other Ambulatory Visit: Payer: Self-pay | Admitting: Internal Medicine

## 2018-11-22 DIAGNOSIS — I10 Essential (primary) hypertension: Secondary | ICD-10-CM

## 2018-11-22 MED ORDER — CHLORTHALIDONE 25 MG PO TABS: 25.0000 mg | ORAL_TABLET | Freq: Every day | ORAL | 0 refills | Status: DC

## 2018-11-22 NOTE — Telephone Encounter (Signed)
Copied from CRM [id number]. Topic: Quick Communication - Rx Refill/Question >> Nov 22, 2018  1:20 PM Arlyss Gandy, NT wrote: Medication: chlorthalidone (HYGROTON) 25 MG tablet  Need 30 day rx sent to local pharmacy until [facility] can refill  Has the patient contacted their pharmacy? Yes.   (Agent: If no, request that the patient contact the pharmacy for the refill.) (Agent: If yes, when and what did the pharmacy advise?)  Preferred Pharmacy (with phone number or street name): [facility] - [place] - [address] [phone] (Phone) [phone] (Fax)    Agent: Please be advised that RX refills may take up to 3 business days. We ask that you follow-up with your pharmacy.

## 2018-12-04 ENCOUNTER — Ambulatory Visit: Payer: Medicare Other | Admitting: Podiatry

## 2019-01-01 ENCOUNTER — Ambulatory Visit: Payer: Medicare Other | Admitting: Podiatry

## 2019-01-02 ENCOUNTER — Telehealth: Payer: Self-pay

## 2019-01-02 NOTE — Telephone Encounter (Signed)
Copied from CRM [id number]. Topic: Appointment Scheduling - Scheduling Inquiry for Clinic >> Jan 02, 2019 10:10 AM Darletta Moll L wrote: Reason for CRM: Patient is experiencing right ear, sinus, and jaw pain and wants an appt same day and close time to her husband. His name is McKinzie,Crawford 05/08/1939. She spoke with someone today at the practice who stated they would talk with Dr. Shirlee Latch and confirm whether he will be doing an e-visit or come into the practice.

## 2019-01-02 NOTE — Telephone Encounter (Signed)
Appointment has been scheduled for telephone appointement [id number]

## 2019-01-02 NOTE — Telephone Encounter (Signed)
We are doing virtual visits only does she want video visit or telephone visit  I rec her husband call his GI/cancer doctors at [facility] for his sore throat   TMS

## 2019-01-03 ENCOUNTER — Ambulatory Visit (INDEPENDENT_AMBULATORY_CARE_PROVIDER_SITE_OTHER): Payer: Medicare Other | Admitting: Internal Medicine

## 2019-01-03 ENCOUNTER — Encounter: Payer: Self-pay | Admitting: Internal Medicine

## 2019-01-03 DIAGNOSIS — J019 Acute sinusitis, unspecified: Secondary | ICD-10-CM | POA: Diagnosis not present

## 2019-01-03 DIAGNOSIS — J309 Allergic rhinitis, unspecified: Secondary | ICD-10-CM | POA: Diagnosis not present

## 2019-01-03 DIAGNOSIS — H6991 Unspecified Eustachian tube disorder, right ear: Secondary | ICD-10-CM | POA: Diagnosis not present

## 2019-01-03 MED ORDER — SALINE SPRAY 0.65 % NA SOLN: 2.0000 | Freq: Every day | NASAL | 4 refills | Status: AC | PRN

## 2019-01-03 MED ORDER — CETIRIZINE HCL 10 MG PO TABS: 10.0000 mg | ORAL_TABLET | Freq: Every evening | ORAL | 3 refills | Status: AC | PRN

## 2019-01-03 NOTE — Progress Notes (Addendum)
Telephone Note  I connected with Jennifer Tanner on 01/03/19 at 11:00 AM EDT -11:15 am by telephone application and verified that I am speaking with the correct person using two identifiers.  Location patient: home Location provider:work  Persons participating in the virtual visit: patient, provider, pts husband   I discussed the limitations of evaluation and management by telemedicine and the availability of in person appointments. The patient expressed understanding and agreed to proceed.   HPI: 1. X 2 days c/o right ear pain, right sided sinus pressure with h/o allergies no sinus congestion. No fever cough pain is 1.5/10 She has been using Flonase but no allergy pill otc  She is allergic to pollen   ROS: See pertinent positives and negatives per HPI.  Past Medical History:  Diagnosis Date  . Allergy   . Arthritis    hips s/p left hip prosthesis x 3, low back sees [facility] Dr. Joneen Caraway [place]  . Depression   . Diabetes mellitus without complication (HCC)   . Hypertension   . Low back pain   . Shingles    2018    Past Surgical History:  Procedure Laterality Date  . JOINT REPLACEMENT     left hip x 3    Family History  Problem Relation Age of Onset  . Early death Mother   . Hypertension Mother   . Cancer Father        spine ?MM  . Heart disease Paternal Grandmother   . Tuberculosis Paternal Grandfather   . Arthritis Cousin   . Breast cancer Neg Hx     SOCIAL HX: married    Current Outpatient Medications:  .  amLODipine (NORVASC) 5 MG tablet, Take 1 tablet (5 mg total) by mouth daily., Disp: 90 tablet, Rfl: 3 .  aspirin 325 MG tablet, aspirin 325 mg tablet,delayed release  TAKE 1 TABLET BY MOUTH TWICE A DAY FOR 6 WEEKS AFTER SURGERY TO DECREASE RISK OF CLOTS, Disp: , Rfl:  .  cetirizine (ZYRTEC) 10 MG tablet, Take 1 tablet (10 mg total) by mouth at bedtime as needed for allergies., Disp: 90 tablet, Rfl: 3 .  chlorthalidone (HYGROTON) 25 MG  tablet, Take 1 tablet (25 mg total) by mouth daily., Disp: 30 tablet, Rfl: 0 .  fluticasone (FLONASE) 50 MCG/ACT nasal spray, Place into the nose., Disp: , Rfl:  .  gabapentin (NEURONTIN) 100 MG capsule, Take 1 capsule (100 mg total) by mouth 2 (two) times daily., Disp: 180 capsule, Rfl: 3 .  JANUVIA 100 MG tablet, Take 1 tablet (100 mg total) by mouth daily., Disp: 90 tablet, Rfl: 3 .  lisinopril (PRINIVIL,ZESTRIL) 20 MG tablet, Take 1 tablet (20 mg total) by mouth daily., Disp: 90 tablet, Rfl: 3 .  meloxicam (MOBIC) 15 MG tablet, Take 1 tablet (15 mg total) by mouth daily., Disp: 30 tablet, Rfl: 3 .  pantoprazole (PROTONIX) 40 MG tablet, Take 1 tablet (40 mg total) by mouth daily. 30 minutes b/f food, Disp: 90 tablet, Rfl: 3 .  phentermine (ADIPEX-P) 37.5 MG tablet, phentermine 37.5 mg tablet  TAKE 1 TABLET BY MOUTH DAILY BEFORE BREAKFAST, Disp: , Rfl:  .  simvastatin (ZOCOR) 20 MG tablet, Take 1 tablet (20 mg total) by mouth daily at 6 PM., Disp: 90 tablet, Rfl: 3 .  sodium chloride (OCEAN) 0.65 % SOLN nasal spray, Place 2 sprays into both nostrils daily as needed. Before use with flonase max Sprays 2 both nostrils, Disp: 3 Bottle, Rfl: 4 .  traZODone (DESYREL) 50 MG tablet, Take 1-2 tablets (50-100 mg total) by mouth at bedtime., Disp: 180 tablet, Rfl: 0 .  UNABLE TO FIND, duloxetine 30 mg capsule,delayed release, Disp: , Rfl:   EXAM: telephone note   VITALS per patient if applicable:  GENERAL: alert, oriented, appears well and in no acute distress  PSYCH/NEURO: pleasant and cooperative, no obvious depression or anxiety, speech and thought processing grossly intact  ASSESSMENT AND PLAN:  Discussed the following assessment and plan: Based on telephone encounter likely  Allergic rhinitis, unspecified seasonality, unspecified trigger - Plan: cetirizine (ZYRTEC) 10 MG tablet, sodium chloride (OCEAN) 0.65 % SOLN nasal spray  Acute sinusitis, recurrence not specified, unspecified location -  Plan: cetirizine (ZYRTEC) 10 MG tablet, sodium chloride (OCEAN) 0.65 % SOLN nasal spray  Disorder of right eustachian tube - Plan: cetirizine (ZYRTEC) 10 MG tablet, sodium chloride (OCEAN) 0.65 % SOLN nasal spray  -if note better call back and will consider Augmentin but hold for now     I discussed the assessment and treatment plan with the patient. The patient was provided an opportunity to ask questions and all were answered. The patient agreed with the plan and demonstrated an understanding of the instructions.   The patient was advised to call back or seek an in-person evaluation if the symptoms worsen or if the condition fails to improve as anticipated.  I provided 15 minutes of non-face-to-face time during this encounter.   [name], MD

## 2019-01-03 NOTE — Patient Instructions (Signed)
Allergic Rhinitis, Adult Allergic rhinitis is an allergic reaction that affects the mucous membrane inside the nose. It causes sneezing, a runny or stuffy nose, and the feeling of mucus going down the back of the throat (postnasal drip). Allergic rhinitis can be mild to severe. There are two types of allergic rhinitis:  Seasonal. This type is also called hay fever. It happens only during certain seasons.  Perennial. This type can happen at any time of the year. What are the causes? This condition happens when the body's defense system (immune system) responds to certain harmless substances called allergens as though they were germs.  Seasonal allergic rhinitis is triggered by pollen, which can come from grasses, trees, and weeds. Perennial allergic rhinitis may be caused by:  House dust mites.  Pet dander.  Mold spores. What are the signs or symptoms? Symptoms of this condition include:  Sneezing.  Runny or stuffy nose (nasal congestion).  Postnasal drip.  Itchy nose.  Tearing of the eyes.  Trouble sleeping.  Daytime sleepiness. How is this diagnosed? This condition may be diagnosed based on:  Your medical history.  A physical exam.  Tests to check for related conditions, such as: ? Asthma. ? Pink eye. ? Ear infection. ? Upper respiratory infection.  Tests to find out which allergens trigger your symptoms. These may include skin or blood tests. How is this treated? There is no cure for this condition, but treatment can help control symptoms. Treatment may include:  Taking medicines that block allergy symptoms, such as antihistamines. Medicine may be given as a shot, nasal spray, or pill.  Avoiding the allergen.  Desensitization. This treatment involves getting ongoing shots until your body becomes less sensitive to the allergen. This treatment may be done if other treatments do not help.  If taking medicine and avoiding the allergen does not work, new, stronger  medicines may be prescribed. Follow these instructions at home:  Find out what you are allergic to. Common allergens include smoke, dust, and pollen.  Avoid the things you are allergic to. These are some things you can do to help avoid allergens: ? Replace carpet with wood, tile, or vinyl flooring. Carpet can trap dander and dust. ? Do not smoke. Do not allow smoking in your home. ? Change your heating and air conditioning filter at least once a month. ? During allergy season:  Keep windows closed as much as possible.  Plan outdoor activities when pollen counts are lowest. This is usually during the evening hours.  When coming indoors, change clothing and shower before sitting on furniture or bedding.  Take over-the-counter and prescription medicines only as told by your health care provider.  Keep all follow-up visits as told by your health care provider. This is important. Contact a health care provider if:  You have a fever.  You develop a persistent cough.  You make whistling sounds when you breathe (you wheeze).  Your symptoms interfere with your normal daily activities. Get help right away if:  You have shortness of breath. Summary  This condition can be managed by taking medicines as directed and avoiding allergens.  Contact your health care provider if you develop a persistent cough or fever.  During allergy season, keep windows closed as much as possible. This information is not intended to replace advice given to you by your health care provider. Make sure you discuss any questions you have with your health care provider. Document Released: 06/15/2001 Document Revised: 10/28/2016 Document Reviewed: 10/28/2016 Elsevier Interactive  Patient Education  2019 [employer]vier Inc.  Sinusitis, Adult Sinusitis is inflammation of your sinuses. Sinuses are hollow spaces in the bones around your face. Your sinuses are located:  Around your eyes.  In the middle of your  forehead.  Behind your nose.  In your cheekbones. Mucus normally drains out of your sinuses. When your nasal tissues become inflamed or swollen, mucus can become trapped or blocked. This allows bacteria, viruses, and fungi to grow, which leads to infection. Most infections of the sinuses are caused by a virus. Sinusitis can develop quickly. It can last for up to 4 weeks (acute) or for more than 12 weeks (chronic). Sinusitis often develops after a cold. What are the causes? This condition is caused by anything that creates swelling in the sinuses or stops mucus from draining. This includes:  Allergies.  Asthma.  Infection from bacteria or viruses.  Deformities or blockages in your nose or sinuses.  Abnormal growths in the nose (nasal polyps).  Pollutants, such as chemicals or irritants in the air.  Infection from fungi (rare). What increases the risk? You are more likely to develop this condition if you:  Have a weak body defense system (immune system).  Do a lot of swimming or diving.  Overuse nasal sprays.  Smoke. What are the signs or symptoms? The main symptoms of this condition are pain and a feeling of pressure around the affected sinuses. Other symptoms include:  Stuffy nose or congestion.  Thick drainage from your nose.  Swelling and warmth over the affected sinuses.  Headache.  Upper toothache.  A cough that may get worse at night.  Extra mucus that collects in the throat or the back of the nose (postnasal drip).  Decreased sense of smell and taste.  Fatigue.  A fever.  Sore throat.  Bad breath. How is this diagnosed? This condition is diagnosed based on:  Your symptoms.  Your medical history.  A physical exam.  Tests to find out if your condition is acute or chronic. This may include: ? Checking your nose for nasal polyps. ? Viewing your sinuses using a device that has a light (endoscope). ? Testing for allergies or bacteria. ? Imaging  tests, such as an MRI or CT scan. In rare cases, a bone biopsy may be done to rule out more serious types of fungal sinus disease. How is this treated? Treatment for sinusitis depends on the cause and whether your condition is chronic or acute.  If caused by a virus, your symptoms should go away on their own within 10 days. You may be given medicines to relieve symptoms. They include: ? Medicines that shrink swollen nasal passages (topical intranasal decongestants). ? Medicines that treat allergies (antihistamines). ? A spray that eases inflammation of the nostrils (topical intranasal corticosteroids). ? Rinses that help get rid of thick mucus in your nose (nasal saline washes).  If caused by bacteria, your health care provider may recommend waiting to see if your symptoms improve. Most bacterial infections will get better without antibiotic medicine. You may be given antibiotics if you have: ? A severe infection. ? A weak immune system.  If caused by narrow nasal passages or nasal polyps, you may need to have surgery. Follow these instructions at home: Medicines  Take, use, or apply over-the-counter and prescription medicines only as told by your health care provider. These may include nasal sprays.  If you were prescribed an antibiotic medicine, take it as told by your health care provider. Do not  stop taking the antibiotic even if you start to feel better. Hydrate and humidify   Drink enough fluid to keep your urine pale yellow. Staying hydrated will help to thin your mucus.  Use a cool mist humidifier to keep the humidity level in your home above 50%.  Inhale steam for 10-15 minutes, 3-4 times a day, or as told by your health care provider. You can do this in the bathroom while a hot shower is running.  Limit your exposure to cool or dry air. Rest  Rest as much as possible.  Sleep with your head raised (elevated).  Make sure you get enough sleep each night. General  instructions   Apply a warm, moist washcloth to your face 3-4 times a day or as told by your health care provider. This will help with discomfort.  Wash your hands often with soap and water to reduce your exposure to germs. If soap and water are not available, use hand sanitizer.  Do not smoke. Avoid being around people who are smoking (secondhand smoke).  Keep all follow-up visits as told by your health care provider. This is important. Contact a health care provider if:  You have a fever.  Your symptoms get worse.  Your symptoms do not improve within 10 days. Get help right away if:  You have a severe headache.  You have persistent vomiting.  You have severe pain or swelling around your face or eyes.  You have vision problems.  You develop confusion.  Your neck is stiff.  You have trouble breathing. Summary  Sinusitis is soreness and inflammation of your sinuses. Sinuses are hollow spaces in the bones around your face.  This condition is caused by nasal tissues that become inflamed or swollen. The swelling traps or blocks the flow of mucus. This allows bacteria, viruses, and fungi to grow, which leads to infection.  If you were prescribed an antibiotic medicine, take it as told by your health care provider. Do not stop taking the antibiotic even if you start to feel better.  Keep all follow-up visits as told by your health care provider. This is important. This information is not intended to replace advice given to you by your health care provider. Make sure you discuss any questions you have with your health care provider. Document Released: 09/20/2005 Document Revised: 02/20/2018 Document Reviewed: 02/20/2018 Elsevier Interactive Patient Education  2019 [employer] Inc.  Eustachian Tube Dysfunction  Eustachian tube dysfunction refers to a condition in which a blockage develops in the narrow passage that connects the middle ear to the back of the nose (eustachian  tube). The eustachian tube regulates air pressure in the middle ear by letting air move between the ear and nose. It also helps to drain fluid from the middle ear space. Eustachian tube dysfunction can affect one or both ears. When the eustachian tube does not function properly, air pressure, fluid, or both can build up in the middle ear. What are the causes? This condition occurs when the eustachian tube becomes blocked or cannot open normally. Common causes of this condition include:  Ear infections.  Colds and other infections that affect the nose, mouth, and throat (upper respiratory tract).  Allergies.  Irritation from cigarette smoke.  Irritation from stomach acid coming up into the esophagus (gastroesophageal reflux). The esophagus is the tube that carries food from the mouth to the stomach.  Sudden changes in air pressure, such as from descending in an airplane or scuba diving.  Abnormal growths in  the nose or throat, such as: ? Growths that line the nose (nasal polyps). ? Abnormal growth of cells (tumors). ? Enlarged tissue at the back of the throat (adenoids). What increases the risk? You are more likely to develop this condition if:  You smoke.  You are overweight.  You are a child who has: ? Certain birth defects of the mouth, such as cleft palate. ? Large tonsils or adenoids. What are the signs or symptoms? Common symptoms of this condition include:  A feeling of fullness in the ear.  Ear pain.  Clicking or popping noises in the ear.  Ringing in the ear.  Hearing loss.  Loss of balance.  Dizziness. Symptoms may get worse when the air pressure around you changes, such as when you travel to an area of high elevation, fly on an airplane, or go scuba diving. How is this diagnosed? This condition may be diagnosed based on:  Your symptoms.  A physical exam of your ears, nose, and throat.  Tests, such as those that measure: ? The movement of your eardrum  (tympanogram). ? Your hearing (audiometry). How is this treated? Treatment depends on the cause and severity of your condition.  In mild cases, you may relieve your symptoms by moving air into your ears. This is called "popping the ears."  In more severe cases, or if you have symptoms of fluid in your ears, treatment may include: ? Medicines to relieve congestion (decongestants). ? Medicines that treat allergies (antihistamines). ? Nasal sprays or ear drops that contain medicines that reduce swelling (steroids). ? A procedure to drain the fluid in your eardrum (myringotomy). In this procedure, a small tube is placed in the eardrum to:  Drain the fluid.  Restore the air in the middle ear space. ? A procedure to insert a balloon device through the nose to inflate the opening of the eustachian tube (balloon dilation). Follow these instructions at home: Lifestyle  Do not do any of the following until your health care provider approves: ? Travel to high altitudes. ? Fly in airplanes. ? Work in a Estate agent or room. ? Scuba dive.  Do not use any products that contain nicotine or tobacco, such as cigarettes and e-cigarettes. If you need help quitting, ask your health care provider.  Keep your ears dry. Wear fitted earplugs during showering and bathing. Dry your ears completely after. General instructions  Take over-the-counter and prescription medicines only as told by your health care provider.  Use techniques to help pop your ears as recommended by your health care provider. These may include: ? Chewing gum. ? Yawning. ? Frequent, forceful swallowing. ? Closing your mouth, holding your nose closed, and gently blowing as if you are trying to blow air out of your nose.  Keep all follow-up visits as told by your health care provider. This is important. Contact a health care provider if:  Your symptoms do not go away after treatment.  Your symptoms come back after treatment.   You are unable to pop your ears.  You have: ? A fever. ? Pain in your ear. ? Pain in your head or neck. ? Fluid draining from your ear.  Your hearing suddenly changes.  You become very dizzy.  You lose your balance. Summary  Eustachian tube dysfunction refers to a condition in which a blockage develops in the eustachian tube.  It can be caused by ear infections, allergies, inhaled irritants, or abnormal growths in the nose or throat.  Symptoms include  ear pain, hearing loss, or ringing in the ears.  Mild cases are treated with maneuvers to unblock the ears, such as yawning or ear popping.  Severe cases are treated with medicines. Surgery may also be done (rare). This information is not intended to replace advice given to you by your health care provider. Make sure you discuss any questions you have with your health care provider. Document Released: 10/17/2015 Document Revised: 01/10/2018 Document Reviewed: 01/10/2018 Elsevier Interactive Patient Education  2019 [employer]

## 2019-01-19 ENCOUNTER — Other Ambulatory Visit: Payer: Self-pay | Admitting: Internal Medicine

## 2019-01-19 ENCOUNTER — Telehealth: Payer: Self-pay | Admitting: Internal Medicine

## 2019-01-19 DIAGNOSIS — E119 Type 2 diabetes mellitus without complications: Secondary | ICD-10-CM

## 2019-01-19 DIAGNOSIS — I10 Essential (primary) hypertension: Secondary | ICD-10-CM

## 2019-01-19 DIAGNOSIS — Z1329 Encounter for screening for other suspected endocrine disorder: Secondary | ICD-10-CM

## 2019-01-19 NOTE — Telephone Encounter (Unsigned)
Copied from CRM [id number]. Topic: Quick Communication - Rx Refill/Question >> Jan 19, 2019 11:51 AM Wyonia Hough E wrote: Medication: Pt wants to know if she can change her JANUVIA 100 MG tablet due to it not working as well anymore. Pt wants to know if se needs to get blood work done as well / please advise

## 2019-01-19 NOTE — Telephone Encounter (Signed)
What type of things is she eating? This can affect her sugar or stress?   Januvia is a good choice and typically works lets check bloodwork for now  Please schedule fasting lab appt and telephone or video visit after labs   Thanks tMS

## 2019-01-19 NOTE — Telephone Encounter (Signed)
Patient notified. Diet hasnt chaged for the past five years. But stress is going out of control. Labs have been scheduled next friday

## 2019-01-23 DIAGNOSIS — I10 Essential (primary) hypertension: Secondary | ICD-10-CM | POA: Diagnosis not present

## 2019-01-23 DIAGNOSIS — I2510 Atherosclerotic heart disease of native coronary artery without angina pectoris: Secondary | ICD-10-CM | POA: Diagnosis not present

## 2019-01-23 DIAGNOSIS — R0602 Shortness of breath: Secondary | ICD-10-CM | POA: Diagnosis not present

## 2019-01-23 DIAGNOSIS — E113211 Type 2 diabetes mellitus with mild nonproliferative diabetic retinopathy with macular edema, right eye: Secondary | ICD-10-CM | POA: Diagnosis not present

## 2019-01-23 DIAGNOSIS — I351 Nonrheumatic aortic (valve) insufficiency: Secondary | ICD-10-CM | POA: Diagnosis not present

## 2019-01-23 DIAGNOSIS — I340 Nonrheumatic mitral (valve) insufficiency: Secondary | ICD-10-CM | POA: Diagnosis not present

## 2019-01-26 ENCOUNTER — Ambulatory Visit (INDEPENDENT_AMBULATORY_CARE_PROVIDER_SITE_OTHER): Payer: Medicare Other | Admitting: Internal Medicine

## 2019-01-26 ENCOUNTER — Other Ambulatory Visit (INDEPENDENT_AMBULATORY_CARE_PROVIDER_SITE_OTHER): Payer: Medicare Other

## 2019-01-26 ENCOUNTER — Other Ambulatory Visit: Payer: Self-pay

## 2019-01-26 DIAGNOSIS — R350 Frequency of micturition: Secondary | ICD-10-CM

## 2019-01-26 DIAGNOSIS — E119 Type 2 diabetes mellitus without complications: Secondary | ICD-10-CM

## 2019-01-26 DIAGNOSIS — I10 Essential (primary) hypertension: Secondary | ICD-10-CM

## 2019-01-26 DIAGNOSIS — Z1329 Encounter for screening for other suspected endocrine disorder: Secondary | ICD-10-CM | POA: Diagnosis not present

## 2019-01-26 LAB — CBC WITH DIFFERENTIAL/PLATELET
Basophils Absolute: 0.1 10*3/uL (ref 0.0–0.1)
Basophils Relative: 1.4 % (ref 0.0–3.0)
Eosinophils Absolute: 0.5 10*3/uL (ref 0.0–0.7)
Eosinophils Relative: 8.8 % — ABNORMAL HIGH (ref 0.0–5.0)
HCT: 43 % (ref 36.0–46.0)
Hemoglobin: 14.6 g/dL (ref 12.0–15.0)
Lymphocytes Relative: 33.3 % (ref 12.0–46.0)
Lymphs Abs: 1.7 10*3/uL (ref 0.7–4.0)
MCHC: 34 g/dL (ref 30.0–36.0)
MCV: 88.2 fl (ref 78.0–100.0)
Monocytes Absolute: 0.6 10*3/uL (ref 0.1–1.0)
Monocytes Relative: 11.4 % (ref 3.0–12.0)
Neutro Abs: 2.3 10*3/uL (ref 1.4–7.7)
Neutrophils Relative %: 45.1 % (ref 43.0–77.0)
Platelets: 243 10*3/uL (ref 150.0–400.0)
RBC: 4.88 Mil/uL (ref 3.87–5.11)
RDW: 14 % (ref 11.5–15.5)
WBC: 5.1 10*3/uL (ref 4.0–10.5)

## 2019-01-26 LAB — COMPREHENSIVE METABOLIC PANEL
ALT: 18 U/L (ref 0–35)
AST: 22 U/L (ref 0–37)
Albumin: 4.4 g/dL (ref 3.5–5.2)
Alkaline Phosphatase: 57 U/L (ref 39–117)
BUN: 15 mg/dL (ref 6–23)
CO2: 27 mEq/L (ref 19–32)
Calcium: 9.6 mg/dL (ref 8.4–10.5)
Chloride: 104 mEq/L (ref 96–112)
Creatinine, Ser: 0.79 mg/dL (ref 0.40–1.20)
GFR: 86.35 mL/min (ref >60.00)
Glucose, Bld: 98 mg/dL (ref 70–99)
Potassium: 3.9 mEq/L (ref 3.5–5.1)
Sodium: 141 mEq/L (ref 135–145)
Total Bilirubin: 0.8 mg/dL (ref 0.2–1.2)
Total Protein: 7.6 g/dL (ref 6.0–8.3)

## 2019-01-26 LAB — HEMOGLOBIN A1C: Hgb A1c MFr Bld: 6 % (ref 4.6–6.5)

## 2019-01-26 LAB — TSH: TSH: 0.82 u[IU]/mL (ref 0.35–4.50)

## 2019-01-26 LAB — LIPID PANEL
Cholesterol: 128 mg/dL (ref 0–200)
HDL: 59.9 mg/dL (ref >39.00)
LDL Cholesterol: 55 mg/dL (ref 0–99)
NonHDL: 67.67
Total CHOL/HDL Ratio: 2
Triglycerides: 65 mg/dL (ref 0.0–149.0)
VLDL: 13 mg/dL (ref 0.0–40.0)

## 2019-01-26 MED ORDER — CHLORTHALIDONE 25 MG PO TABS: 12.5000 mg | ORAL_TABLET | Freq: Every day | ORAL | 0 refills | Status: DC

## 2019-01-26 MED ORDER — ROSUVASTATIN CALCIUM 10 MG PO TABS: 10.0000 mg | ORAL_TABLET | Freq: Every day | ORAL | Status: AC

## 2019-01-26 NOTE — Progress Notes (Signed)
Telephone Note  I connected with Jennifer Tanner  on 01/26/19 at  2:15 PM EDT by telephone and verified that I am speaking with the correct person using two identifiers.  Location patient: home Location provider:work  Persons participating in the virtual visit: patient, provider, pts husband   I discussed the limitations of evaluation and management by telemedicine and the availability of in person appointments. The patient expressed understanding and agreed to proceed.   HPI: 1. DM 2 glucose was 95-120 now when she is waking up 120-158 and diet is healthier and not changed. Last A1C 6.1 on Januvia 100 mg prev did not tolerate metformin due to diarrhea  -per pt saw Dr. Milta Deiters 01/25/2019 was on zocor 20 mg she was worried this medication could be effecting her blood sugar so he changed it to crestor 10 mg qhs and wants a copy of her labs from recently   2. Overactive bladder worse at night pm chlorthalidone 25 mg qd she is not drinking too much caffeine and reducing liquids at night but meds make her mouth dry. She is peeing 3-4 x at night w/o UTI sx's could not leave urine today   3. HTN on chlorthalidone 25 mg qd, lis 20 mg qd and norvasc 5 mg qd    ROS: See pertinent positives and negatives per HPI.  Past Medical History:  Diagnosis Date  . Allergy   . Arthritis    hips s/p left hip prosthesis x 3, low back sees [facility] Dr. Joneen Caraway [place]  . Depression   . Diabetes mellitus without complication (HCC)   . Hypertension   . Low back pain   . Shingles    2018    Past Surgical History:  Procedure Laterality Date  . JOINT REPLACEMENT     left hip x 3    Family History  Problem Relation Age of Onset  . Early death Mother   . Hypertension Mother   . Cancer Father        spine ?MM  . Heart disease Paternal Grandmother   . Tuberculosis Paternal Grandfather   . Arthritis Cousin   . Breast cancer Neg Hx     SOCIAL HX: married    Current Outpatient  Medications:  .  amLODipine (NORVASC) 5 MG tablet, Take 1 tablet (5 mg total) by mouth daily., Disp: 90 tablet, Rfl: 3 .  aspirin 325 MG tablet, aspirin 325 mg tablet,delayed release  TAKE 1 TABLET BY MOUTH TWICE A DAY FOR 6 WEEKS AFTER SURGERY TO DECREASE RISK OF CLOTS, Disp: , Rfl:  .  cetirizine (ZYRTEC) 10 MG tablet, Take 1 tablet (10 mg total) by mouth at bedtime as needed for allergies., Disp: 90 tablet, Rfl: 3 .  chlorthalidone (HYGROTON) 25 MG tablet, Take 0.5 tablets (12.5 mg total) by mouth daily., Disp: 30 tablet, Rfl: 0 .  fluticasone (FLONASE) 50 MCG/ACT nasal spray, Place into the nose., Disp: , Rfl:  .  gabapentin (NEURONTIN) 100 MG capsule, Take 1 capsule (100 mg total) by mouth 2 (two) times daily., Disp: 180 capsule, Rfl: 3 .  JANUVIA 100 MG tablet, Take 1 tablet (100 mg total) by mouth daily., Disp: 90 tablet, Rfl: 3 .  lisinopril (PRINIVIL,ZESTRIL) 20 MG tablet, Take 1 tablet (20 mg total) by mouth daily., Disp: 90 tablet, Rfl: 3 .  meloxicam (MOBIC) 15 MG tablet, Take 1 tablet (15 mg total) by mouth daily., Disp: 30 tablet, Rfl: 3 .  pantoprazole (PROTONIX) 40 MG tablet,  Take 1 tablet (40 mg total) by mouth daily. 30 minutes b/f food, Disp: 90 tablet, Rfl: 3 .  phentermine (ADIPEX-P) 37.5 MG tablet, phentermine 37.5 mg tablet  TAKE 1 TABLET BY MOUTH DAILY BEFORE BREAKFAST, Disp: , Rfl:  .  rosuvastatin (CRESTOR) 10 MG tablet, Take 1 tablet (10 mg total) by mouth daily., Disp: , Rfl:  .  sodium chloride (OCEAN) 0.65 % SOLN nasal spray, Place 2 sprays into both nostrils daily as needed. Before use with flonase max Sprays 2 both nostrils, Disp: 3 Bottle, Rfl: 4 .  traZODone (DESYREL) 50 MG tablet, Take 1-2 tablets (50-100 mg total) by mouth at bedtime., Disp: 180 tablet, Rfl: 0 .  UNABLE TO FIND, duloxetine 30 mg capsule,delayed release, Disp: , Rfl:   EXAM:  VITALS per patient if applicable:  GENERAL: alert, oriented, appears well and in no acute distress  PSYCH/NEURO:  pleasant and cooperative, no obvious depression or anxiety, speech and thought processing grossly intact  ASSESSMENT AND PLAN:  Discussed the following assessment and plan:  Urine frequency ? Medication related vs overactive r/o infection - Plan: UA and Urine Culture  Essential hypertension - Plan: chlorthalidone (HYGROTON) 25 MG tablet qd reduce to 1/2 pill=12.5 mg qd advised pt to cut pill in 1/2   Type 2 diabetes mellitus without complication, controlled A1C 6.1>6.0  - Plan: rosuvastatin (CRESTOR) 10 MG tablet -check urine microalbumin/creatinine  -cont januvia 100 mg qd   HM Had flu shot 06/24/18 Tdap 2018 ? [facility] had 06/27/15 or 05/31/11  pna 23 06/08/11 per [facility]  -consider pna 23 in future  prevnar 08/22/14  Had shingrix 1/2 05/02/17 and 2/2 07/2017  Need to check [facility] on all vaccines as well   Hep C negative   Out of age window pap never had hysterectomy LMP 55   Col[place] years/[place]ago [place] but cant remember where but negative. -cologuard neg 11/14/2017  DEXA 09/07/16 normal AP spine, femoral neck right, total hip right osteopeniaconsider repeat in 3-5 years   Mammogram  10/12/2018 negative [place]  Never smoker     I discussed the assessment and treatment plan with the patient. The patient was provided an opportunity to ask questions and all were answered. The patient agreed with the plan and demonstrated an understanding of the instructions.   The patient was advised to call back or seek an in-person evaluation if the symptoms worsen or if the condition fails to improve as anticipated.  Time spent 15 minutes   Bevelyn Buckles, MD

## 2019-01-26 NOTE — Addendum Note (Signed)
Addended by: Warden Fillers on: 01/26/2019 09:45 AM   Modules accepted: Orders

## 2019-01-26 NOTE — Addendum Note (Signed)
Addended by: Warden Fillers on: 01/26/2019 02:59 PM   Modules accepted: Orders

## 2019-01-27 LAB — URINE CULTURE
MICRO NUMBER:: 420173
SPECIMEN QUALITY:: ADEQUATE

## 2019-01-27 LAB — URINALYSIS, ROUTINE W REFLEX MICROSCOPIC
Bacteria, UA: NONE SEEN /HPF
Bilirubin Urine: NEGATIVE
Glucose, UA: NEGATIVE
Hgb urine dipstick: NEGATIVE
Hyaline Cast: NONE SEEN /LPF
Nitrite: NEGATIVE
Protein, ur: NEGATIVE
Specific Gravity, Urine: 1.026 (ref 1.001–1.03)
pH: 6 (ref 5.0–8.0)

## 2019-01-27 LAB — MICROALBUMIN / CREATININE URINE RATIO
Creatinine, Urine: 217 mg/dL (ref 20–275)
Microalb Creat Ratio: 6 mcg/mg creat (ref <30)
Microalb, Ur: 1.2 mg/dL

## 2019-01-29 NOTE — Progress Notes (Signed)
patint not in [place]

## 2019-01-31 NOTE — Telephone Encounter (Signed)
Provided lab results of Dr.  Desma Maxim -Scocuzza 01/26/19.  Patient voiced understanding.  Patient requested to make an appointment to discuss her diabetic medication.   Transferred Patient to [place] office to [place] .

## 2019-02-02 ENCOUNTER — Ambulatory Visit (INDEPENDENT_AMBULATORY_CARE_PROVIDER_SITE_OTHER): Payer: Medicare Other | Admitting: Internal Medicine

## 2019-02-02 DIAGNOSIS — F339 Major depressive disorder, recurrent, unspecified: Secondary | ICD-10-CM | POA: Diagnosis not present

## 2019-02-02 DIAGNOSIS — G4700 Insomnia, unspecified: Secondary | ICD-10-CM | POA: Diagnosis not present

## 2019-02-02 DIAGNOSIS — Z6841 Body Mass Index (BMI) 40.0 and over, adult: Secondary | ICD-10-CM | POA: Diagnosis not present

## 2019-02-02 DIAGNOSIS — E785 Hyperlipidemia, unspecified: Secondary | ICD-10-CM

## 2019-02-02 DIAGNOSIS — E1165 Type 2 diabetes mellitus with hyperglycemia: Secondary | ICD-10-CM | POA: Diagnosis not present

## 2019-02-02 DIAGNOSIS — R350 Frequency of micturition: Secondary | ICD-10-CM

## 2019-02-02 DIAGNOSIS — R739 Hyperglycemia, unspecified: Secondary | ICD-10-CM

## 2019-02-02 DIAGNOSIS — Z136 Encounter for screening for cardiovascular disorders: Secondary | ICD-10-CM

## 2019-02-02 MED ORDER — BLOOD GLUCOSE METER KIT: PACK | 0 refills | Status: AC

## 2019-02-02 NOTE — Progress Notes (Addendum)
Telephone Note  I connected with Jennifer Tanner  on 02/02/19 at 11:11 AM EDT by telephone verified that I am speaking with the correct person using two identifiers.  Location patient: home Location provider:work  Persons participating in the virtual visit: patient, provider  I discussed the limitations of evaluation and management by telemedicine and the availability of in person appointments. The patient expressed understanding and agreed to proceed.   HPI: 1. DM 2 A1C 6.0 from 6.1 she is c/w due to elevated cbgs in the am fasting fasting gluc was 90s-110 now 137-188 in the am and she is eating better and exercising via walking daily. She reports her One Touch meter is old and wants a new one. She washes her hands before testing   She is on januvia 100 mg qd and could not tolerate Metformin in the past and also declines Jardiance  We discussed stress could be a factor in elevated glucose as her husband has esophageal cancer which he has decided no further treatment.   2. C/o increased urinary frequency negative UTI 01/26/2019  3. Mood and sleep trazadone is helping reviewed side effects and no side effect this can elevated cbgs     ROS: See pertinent positives and negatives per HPI.  Past Medical History:  Diagnosis Date  . Allergy   . Arthritis    hips s/p left hip prosthesis x 3, low back sees [facility] Dr. Alferd Apa [place]  . Depression   . Diabetes mellitus without complication ([place])   . Hypertension   . Low back pain   . Shingles    2018    Past Surgical History:  Procedure Laterality Date  . JOINT REPLACEMENT     left hip x 3    Family History  Problem Relation Age of Onset  . Early death Mother   . Hypertension Mother   . Cancer Father        spine ?MM  . Heart disease Paternal Grandmother   . Tuberculosis Paternal Grandfather   . Arthritis Cousin   . Breast cancer Neg Hx     SOCIAL HX: married   Current Outpatient Medications:   .  amLODipine (NORVASC) 5 MG tablet, Take 1 tablet (5 mg total) by mouth daily., Disp: 90 tablet, Rfl: 3 .  aspirin 325 MG tablet, aspirin 325 mg tablet,delayed release  TAKE 1 TABLET BY MOUTH TWICE A DAY FOR 6 WEEKS AFTER SURGERY TO DECREASE RISK OF CLOTS, Disp: , Rfl:  .  blood glucose meter kit and supplies, Dispense based on patient and insurance preference. Use up to four times daily as directed. (FOR ICD-10 E10.9, E11.9)., Disp: 1 each, Rfl: 0 .  cetirizine (ZYRTEC) 10 MG tablet, Take 1 tablet (10 mg total) by mouth at bedtime as needed for allergies., Disp: 90 tablet, Rfl: 3 .  chlorthalidone (HYGROTON) 25 MG tablet, Take 0.5 tablets (12.5 mg total) by mouth daily., Disp: 30 tablet, Rfl: 0 .  CINNAMON PO, Take by mouth., Disp: , Rfl:  .  fluticasone (FLONASE) 50 MCG/ACT nasal spray, Place into the nose., Disp: , Rfl:  .  gabapentin (NEURONTIN) 100 MG capsule, Take 1 capsule (100 mg total) by mouth 2 (two) times daily., Disp: 180 capsule, Rfl: 3 .  JANUVIA 100 MG tablet, Take 1 tablet (100 mg total) by mouth daily., Disp: 90 tablet, Rfl: 3 .  lisinopril (PRINIVIL,ZESTRIL) 20 MG tablet, Take 1 tablet (20 mg total) by mouth daily., Disp: 90 tablet, Rfl: 3 .  meloxicam (MOBIC) 15 MG tablet, Take 1 tablet (15 mg total) by mouth daily., Disp: 30 tablet, Rfl: 3 .  pantoprazole (PROTONIX) 40 MG tablet, Take 1 tablet (40 mg total) by mouth daily. 30 minutes b/f food, Disp: 90 tablet, Rfl: 3 .  phentermine (ADIPEX-P) 37.5 MG tablet, phentermine 37.5 mg tablet  TAKE 1 TABLET BY MOUTH DAILY BEFORE BREAKFAST, Disp: , Rfl:  .  rosuvastatin (CRESTOR) 10 MG tablet, Take 1 tablet (10 mg total) by mouth daily., Disp: , Rfl:  .  simvastatin (ZOCOR) 40 MG tablet, , Disp: , Rfl:  .  sodium chloride (OCEAN) 0.65 % SOLN nasal spray, Place 2 sprays into both nostrils daily as needed. Before use with flonase max Sprays 2 both nostrils, Disp: 3 Bottle, Rfl: 4 .  traZODone (DESYREL) 50 MG tablet, Take 1-2 tablets  (50-100 mg total) by mouth at bedtime., Disp: 180 tablet, Rfl: 0 .  UNABLE TO FIND, duloxetine 30 mg capsule,delayed release, Disp: , Rfl:   EXAM:  VITALS per patient if applicable:  GENERAL: alert, oriented, appears well and in no acute distress  PSYCH/NEURO: pleasant and cooperative, no obvious depression or anxiety, speech and thought processing grossly intact  ASSESSMENT AND PLAN:  Discussed the following assessment and plan:  Type 2 diabetes mellitus with hyperglycemia, without long-term current use of insulin ([place]) - Plan: blood glucose meter kit and  -A1C 6.0 01/26/2019  -hyperglycemia could be related to elevated stress levels though consider further w/u -on Januvia 100 mg qd and Cinnamon 500 mg qd  -will disc with endocrine Dr. Lucilla Lame if am cortisol, insulin levels, US abdomen (I.e to visualize pancreas) to consider would be worthwhile as pt is convinced it is not her diet and she is exercising more.  Another thought is would certain meds I.e cholesterol meds be elevating glucose but she has been on these recently changed from zocor to crestor and statins can have this side effect  -with obesity and weight gain will try to get Saxenda approved again to help with cbgs but if not disc Trulicity/Ozempic if insurance will not cover   Urine frequency urine culture negative UTI 01/26/2019 likely could have been diuretic chlorthalidone which reduced from 25 to 12.5 mg qam  Depression/insomnia improved on Trazodone   HM Had flu shot 06/24/18 Tdap 2018? [facility] 06/27/15 or 05/31/11 pna 239/4/12 [facility] -consider pna 23 in future  prevnar11/19/15 Had shingrix 2/2 doses - 1/2 05/02/17 and 2/2 07/2017 Need to check [facility] on all vaccines as well  Hep C negative   Out of age window pap never had hysterectomy LMP 55   [place] but [place] remember where but negative. -cologuard neg 2/11/2019repeat in 3 years    DEXA 09/07/16 normal AP spine, femoral neck right, total hip right osteopeniaconsider repeat in 3-5 years   Mammogram  10/12/2018 negative [place]  Never smoker    I discussed the assessment and treatment plan with the patient. The patient was provided an opportunity to ask questions and all were answered. The patient agreed with the plan and demonstrated an understanding of the instructions.   The patient was advised to call back or seek an in-person evaluation if the symptoms worsen or if the condition fails to improve as anticipated.  Time spent 15 minutes  Delorise Jackson, MD

## 2019-02-05 ENCOUNTER — Encounter: Payer: Self-pay | Admitting: Podiatry

## 2019-02-05 ENCOUNTER — Telehealth: Payer: Self-pay | Admitting: Internal Medicine

## 2019-02-05 ENCOUNTER — Other Ambulatory Visit: Payer: Self-pay

## 2019-02-05 ENCOUNTER — Ambulatory Visit (INDEPENDENT_AMBULATORY_CARE_PROVIDER_SITE_OTHER): Payer: Medicare Other | Admitting: Podiatry

## 2019-02-05 VITALS — Temp 97.1°F

## 2019-02-05 DIAGNOSIS — E119 Type 2 diabetes mellitus without complications: Secondary | ICD-10-CM

## 2019-02-05 DIAGNOSIS — B351 Tinea unguium: Secondary | ICD-10-CM

## 2019-02-05 DIAGNOSIS — M79676 Pain in unspecified toe(s): Secondary | ICD-10-CM | POA: Diagnosis not present

## 2019-02-05 DIAGNOSIS — M79609 Pain in unspecified limb: Principal | ICD-10-CM

## 2019-02-05 MED ORDER — SEMAGLUTIDE(0.25 OR 0.5MG/DOS) 2 MG/1.5ML ~~LOC~~ SOPN: 0.2500 mg | PEN_INJECTOR | SUBCUTANEOUS | 5 refills | Status: DC

## 2019-02-05 NOTE — Telephone Encounter (Signed)
Left message for patient to return call back. [facility] may give and obtain information.  

## 2019-02-05 NOTE — Progress Notes (Signed)
Complaint:  Visit Type: Patient returns to my office for continued preventative foot care services. Complaint: Patient states" my nails have grown long and thick and become painful to walk and wear shoes" Patient has been diagnosed with DM with no foot complications. The patient presents for preventative foot care services. No changes to ROS.    Podiatric Exam: Vascular: dorsalis pedis and posterior tibial pulses are palpable bilateral. Capillary return is immediate. Temperature gradient is WNL. Skin turgor WNL  Sensorium: Normal Semmes Weinstein monofilament test. Normal tactile sensation bilaterally. Nail Exam: Pt has thick disfigured discolored nails with subungual debris noted bilateral entire nail hallux through fifth toenails Ulcer Exam: There is no evidence of ulcer or pre-ulcerative changes or infection. Orthopedic Exam: Muscle tone and strength are WNL. No limitations in general ROM. No crepitus or effusions noted. HAV  B/L. Skin: No Porokeratosis. No infection or ulcers  Diagnosis:  Onychomycosis, , Pain in right toe, pain in left toes  Treatment & Plan Procedures and Treatment: Consent by patient was obtained for treatment procedures. The patient understood the discussion of treatment and procedures well. All questions were answered thoroughly reviewed. Debridement of mycotic and hypertrophic toenails, 1 through 5 bilateral and clearing of subungual debris. No ulceration, no infection noted..   Return Visit-Office Procedure: Patient instructed to return to the office for a follow up visit 4 months   for continued evaluation and treatment.    Helane Gunther DPM

## 2019-02-05 NOTE — Telephone Encounter (Signed)
Mal Amabile advise pt   --->Sent Ozempic will see if insurance will cover though they prefer Trulicity  Ozempic may provider more weight loss benefit but Saxenda which is daily is indicated for weight loss and diabetes and may offer more weight loss (~12 or more pounds) long term vs these 2 options (Ozempic and Trulicity) are 1x per week indicated for diabetes but offer avg 4-9 lbs in weight loss.    Melissa  Ordered US abdomen as well Melissa please schedule   Thanks TMS

## 2019-02-05 NOTE — Telephone Encounter (Signed)
We discussed Trulicity and Ozempic to help with elevated sugar and weight  Bernie Covey is the one that's approved for weight loss and diabetes and we tried to get this approved for weight loss and may help more with weigh tloss but her insurance denied in the past. Bernie Covey is daily the others are 1x per week   Does she want to try Trulicity or Ozempic? Or does she want me to try to send Saxenda and get approved again?   Also does she want me to order and ultrasound of her abdomen to try to look at her pancreas to make sure it is normal?   I am asking a diabetes doctor what she thinks as well   Take care and let me know  TMS

## 2019-02-05 NOTE — Addendum Note (Signed)
Addended by: Quentin Ore on: 02/05/2019 07:54 PM   Modules accepted: Orders

## 2019-02-05 NOTE — Telephone Encounter (Signed)
Pt was read note of Dr Mclean-Scocuzza. Pt will try Trulicity or Ozempic. She just wants the one which will cause the least weight gain. She agrees to have an [place] of her abdomin.  She states her BS are rising Fasting today 201. She has a headache.  She has an appointment set for tomorrow with you. Please advise.

## 2019-02-06 ENCOUNTER — Ambulatory Visit (INDEPENDENT_AMBULATORY_CARE_PROVIDER_SITE_OTHER): Payer: Medicare Other | Admitting: Internal Medicine

## 2019-02-06 DIAGNOSIS — Z794 Long term (current) use of insulin: Secondary | ICD-10-CM | POA: Diagnosis not present

## 2019-02-06 DIAGNOSIS — E119 Type 2 diabetes mellitus without complications: Secondary | ICD-10-CM | POA: Diagnosis not present

## 2019-02-06 DIAGNOSIS — E1165 Type 2 diabetes mellitus with hyperglycemia: Secondary | ICD-10-CM

## 2019-02-06 NOTE — Telephone Encounter (Signed)
Patient has appointment today with PCP  °

## 2019-02-06 NOTE — Progress Notes (Signed)
Telephone Note  I connected with Jennifer Tanner  on 02/06/19 at  1:01 PM EDT by telephone and verified that I am speaking with the correct person using two identifiers.  Location patient: home Location provider:work  Persons participating in the virtual visit: patient, provider  I discussed the limitations of evaluation and management by telemedicine and the availability of in person appointments. The patient expressed understanding and agreed to proceed.   HPI: 1. DM 2 with hyperglycemia new meter readings with cbg 172 fasting this am before januvia 100 and after januvia 100 mg and atknis drink 114 and then 02/05/2019 cbg was 207 before meds. Reports increased stress due to health of 2nd husband declining and she went through this with 1st husband    ROS: See pertinent positives and negatives per HPI.  Past Medical History:  Diagnosis Date  . Allergy   . Arthritis    hips s/p left hip prosthesis x 3, low back sees [facility] Dr. Alferd Apa [place]  . Depression   . Diabetes mellitus without complication ([place])   . Hypertension   . Low back pain   . Shingles    2018    Past Surgical History:  Procedure Laterality Date  . JOINT REPLACEMENT     left hip x 3    Family History  Problem Relation Age of Onset  . Early death Mother   . Hypertension Mother   . Cancer Father        spine ?MM  . Heart disease Paternal Grandmother   . Tuberculosis Paternal Grandfather   . Arthritis Cousin   . Breast cancer Neg Hx     Social hx: married    Current Outpatient Medications:  .  amLODipine (NORVASC) 5 MG tablet, Take 1 tablet (5 mg total) by mouth daily., Disp: 90 tablet, Rfl: 3 .  aspirin 325 MG tablet, aspirin 325 mg tablet,delayed release  TAKE 1 TABLET BY MOUTH TWICE A DAY FOR 6 WEEKS AFTER SURGERY TO DECREASE RISK OF CLOTS, Disp: , Rfl:  .  blood glucose meter kit and supplies, Dispense based on patient and insurance preference. Use up to four times daily as  directed. (FOR ICD-10 E10.9, E11.9)., Disp: 1 each, Rfl: 0 .  cetirizine (ZYRTEC) 10 MG tablet, Take 1 tablet (10 mg total) by mouth at bedtime as needed for allergies., Disp: 90 tablet, Rfl: 3 .  chlorthalidone (HYGROTON) 25 MG tablet, Take 0.5 tablets (12.5 mg total) by mouth daily., Disp: 30 tablet, Rfl: 0 .  CINNAMON PO, Take by mouth., Disp: , Rfl:  .  fluticasone (FLONASE) 50 MCG/ACT nasal spray, Place into the nose., Disp: , Rfl:  .  gabapentin (NEURONTIN) 100 MG capsule, Take 1 capsule (100 mg total) by mouth 2 (two) times daily., Disp: 180 capsule, Rfl: 3 .  JANUVIA 100 MG tablet, Take 1 tablet (100 mg total) by mouth daily., Disp: 90 tablet, Rfl: 3 .  lisinopril (PRINIVIL,ZESTRIL) 20 MG tablet, Take 1 tablet (20 mg total) by mouth daily., Disp: 90 tablet, Rfl: 3 .  meloxicam (MOBIC) 15 MG tablet, Take 1 tablet (15 mg total) by mouth daily., Disp: 30 tablet, Rfl: 3 .  pantoprazole (PROTONIX) 40 MG tablet, Take 1 tablet (40 mg total) by mouth daily. 30 minutes b/f food, Disp: 90 tablet, Rfl: 3 .  phentermine (ADIPEX-P) 37.5 MG tablet, phentermine 37.5 mg tablet  TAKE 1 TABLET BY MOUTH DAILY BEFORE BREAKFAST, Disp: , Rfl:  .  rosuvastatin (CRESTOR) 10 MG tablet,  Take 1 tablet (10 mg total) by mouth daily., Disp: , Rfl:  .  Semaglutide,0.25 or 0.5MG/DOS, (OZEMPIC, 0.25 OR 0.5 MG/DOSE,) 2 MG/1.5ML SOPN, Inject 0.25 mg into the skin once a week. X 1 month if needed after 1 month may increase to 0.5 mg 1x per week, Disp: 5 pen, Rfl: 5 .  simvastatin (ZOCOR) 40 MG tablet, , Disp: , Rfl:  .  sodium chloride (OCEAN) 0.65 % SOLN nasal spray, Place 2 sprays into both nostrils daily as needed. Before use with flonase max Sprays 2 both nostrils, Disp: 3 Bottle, Rfl: 4 .  traZODone (DESYREL) 50 MG tablet, Take 1-2 tablets (50-100 mg total) by mouth at bedtime., Disp: 180 tablet, Rfl: 0 .  UNABLE TO FIND, duloxetine 30 mg capsule,delayed release, Disp: , Rfl:   EXAM:  VITALS per patient if  applicable:  GENERAL: alert, oriented, appears well and in no acute distress  PSYCH/NEURO: pleasant and cooperative, no obvious depression or anxiety, speech and thought processing grossly intact, increased stress  ASSESSMENT AND PLAN:  Discussed the following assessment and plan:  Type 2 diabetes mellitus without complication, without long-term current use of insulin (HCC)  Type 2 diabetes mellitus with hyperglycemia, with long-term current use of insulin (HCC) -will try to get ozempic or trulicity approved doesn't want saxenda  Discuss case with Dr. Gabriel Carina endocrine likely 2/2 increased stress  Cont januvia 100 mg qd for now     I discussed the assessment and treatment plan with the patient. The patient was provided an opportunity to ask questions and all were answered. The patient agreed with the plan and demonstrated an understanding of the instructions.   The patient was advised to call back or seek an in-person evaluation if the symptoms worsen or if the condition fails to improve as anticipated.  Time spent 15 minutes  Delorise Jackson, MD

## 2019-02-07 ENCOUNTER — Other Ambulatory Visit: Payer: Self-pay | Admitting: Internal Medicine

## 2019-02-07 DIAGNOSIS — E1165 Type 2 diabetes mellitus with hyperglycemia: Secondary | ICD-10-CM

## 2019-02-07 MED ORDER — PEN NEEDLES 30G X 8 MM MISC: 1.0000 | 0 refills | Status: AC

## 2019-02-07 MED ORDER — DULAGLUTIDE 0.75 MG/0.5ML ~~LOC~~ SOAJ: 0.7500 mg | SUBCUTANEOUS | 2 refills | Status: DC

## 2019-02-08 ENCOUNTER — Telehealth: Payer: Self-pay | Admitting: Internal Medicine

## 2019-02-08 ENCOUNTER — Telehealth: Payer: Self-pay

## 2019-02-08 NOTE — Telephone Encounter (Signed)
Copied from CRM [id number]. Topic: Appointment Scheduling - Scheduling Inquiry for Clinic >> Feb 07, 2019  4:04 PM Baldo Daub L wrote: Reason for CRM:   Pt states she is returning a call about getting a follow up visit scheduled.  Tried calling office 3x.    Called and spoke to pt and she stated she sopped taking her melatonin at night because her sugars was running high and after she stopped they went to normal, I spoke with the provider about that and she looked up the ingredients and found that the melatonin had can sugar as an ingredient so she was right to stop. Also pt was advised to have her U/S that was ordered anyway and pt understood.  Devonna Oboyle,cma

## 2019-02-08 NOTE — Telephone Encounter (Signed)
Copied from CRM [id number]. Topic: Quick Communication - See Telephone Encounter >> Feb 08, 2019  1:05 PM Jens Som A wrote: CRM for notification. See Telephone encounter for: 02/08/19.  Patient is call stating that the pharmacy is needing a PA for Trulicity. Please advise Thank you.  [address] [place], [place] - [address] [phone] (Phone) [phone] (Fax)

## 2019-02-14 ENCOUNTER — Other Ambulatory Visit: Payer: Self-pay | Admitting: Internal Medicine

## 2019-02-14 ENCOUNTER — Ambulatory Visit: Payer: Medicare Other

## 2019-02-14 DIAGNOSIS — F32A Depression, unspecified: Secondary | ICD-10-CM

## 2019-02-14 DIAGNOSIS — F329 Major depressive disorder, single episode, unspecified: Secondary | ICD-10-CM

## 2019-02-14 DIAGNOSIS — G4700 Insomnia, unspecified: Secondary | ICD-10-CM

## 2019-02-14 MED ORDER — TRAZODONE HCL 50 MG PO TABS: 50.0000 mg | ORAL_TABLET | Freq: Every day | ORAL | 3 refills | Status: DC

## 2019-02-14 NOTE — Progress Notes (Signed)
Called pt for update on her husband Jennifer Tanner who is now in hospice and also my patient   She needs refill of trazadone which is helping sleep and depression   DM2 trulicity approved she wants to try this instead of januvia 100 and will hold on taking Trulicity   TMS

## 2019-02-27 ENCOUNTER — Other Ambulatory Visit: Payer: Self-pay | Admitting: Internal Medicine

## 2019-02-27 DIAGNOSIS — E1165 Type 2 diabetes mellitus with hyperglycemia: Secondary | ICD-10-CM

## 2019-02-27 MED ORDER — DULAGLUTIDE 0.75 MG/0.5ML ~~LOC~~ SOAJ: 0.7500 mg | SUBCUTANEOUS | 2 refills | Status: DC

## 2019-03-01 ENCOUNTER — Other Ambulatory Visit: Payer: Self-pay | Admitting: Internal Medicine

## 2019-03-01 DIAGNOSIS — F329 Major depressive disorder, single episode, unspecified: Secondary | ICD-10-CM

## 2019-03-01 DIAGNOSIS — E1165 Type 2 diabetes mellitus with hyperglycemia: Secondary | ICD-10-CM

## 2019-03-01 DIAGNOSIS — M069 Rheumatoid arthritis, unspecified: Secondary | ICD-10-CM

## 2019-03-01 DIAGNOSIS — G4700 Insomnia, unspecified: Secondary | ICD-10-CM

## 2019-03-01 DIAGNOSIS — F32A Depression, unspecified: Secondary | ICD-10-CM

## 2019-03-01 MED ORDER — MELOXICAM 15 MG PO TABS: 15.0000 mg | ORAL_TABLET | Freq: Every day | ORAL | 3 refills | Status: AC | PRN

## 2019-03-01 MED ORDER — TRAZODONE HCL 50 MG PO TABS: 50.0000 mg | ORAL_TABLET | Freq: Every day | ORAL | 3 refills | Status: AC

## 2019-03-01 MED ORDER — DULAGLUTIDE 0.75 MG/0.5ML ~~LOC~~ SOAJ: 0.7500 mg | SUBCUTANEOUS | 2 refills | Status: DC

## 2019-03-02 ENCOUNTER — Ambulatory Visit
Admission: RE | Admit: 2019-03-02 | Discharge: 2019-03-02 | Disposition: A | Discharge status: Home / Self Care | Payer: Medicare Other | Source: Ambulatory Visit | Attending: Internal Medicine | Admitting: Internal Medicine

## 2019-03-02 ENCOUNTER — Other Ambulatory Visit: Payer: Self-pay

## 2019-03-02 DIAGNOSIS — E1165 Type 2 diabetes mellitus with hyperglycemia: Secondary | ICD-10-CM | POA: Insufficient documentation

## 2019-03-02 DIAGNOSIS — Z136 Encounter for screening for cardiovascular disorders: Secondary | ICD-10-CM | POA: Insufficient documentation

## 2019-03-02 DIAGNOSIS — R739 Hyperglycemia, unspecified: Secondary | ICD-10-CM | POA: Diagnosis not present

## 2019-03-02 DIAGNOSIS — E785 Hyperlipidemia, unspecified: Secondary | ICD-10-CM | POA: Diagnosis not present

## 2019-03-02 DIAGNOSIS — R350 Frequency of micturition: Secondary | ICD-10-CM | POA: Diagnosis not present

## 2019-03-20 ENCOUNTER — Encounter: Payer: Self-pay | Admitting: Internal Medicine

## 2019-04-16 DIAGNOSIS — M79675 Pain in left toe(s): Secondary | ICD-10-CM | POA: Diagnosis not present

## 2019-04-16 DIAGNOSIS — B351 Tinea unguium: Secondary | ICD-10-CM | POA: Diagnosis not present

## 2019-04-16 DIAGNOSIS — E119 Type 2 diabetes mellitus without complications: Secondary | ICD-10-CM | POA: Diagnosis not present

## 2019-04-16 DIAGNOSIS — M79674 Pain in right toe(s): Secondary | ICD-10-CM | POA: Diagnosis not present

## 2019-04-18 DIAGNOSIS — F5101 Primary insomnia: Secondary | ICD-10-CM | POA: Diagnosis not present

## 2019-04-18 DIAGNOSIS — E1100 Type 2 diabetes mellitus with hyperosmolarity without nonketotic hyperglycemic-hyperosmolar coma (NKHHC): Secondary | ICD-10-CM | POA: Diagnosis not present

## 2019-05-04 ENCOUNTER — Other Ambulatory Visit: Payer: Self-pay | Admitting: Internal Medicine

## 2019-05-04 DIAGNOSIS — I10 Essential (primary) hypertension: Secondary | ICD-10-CM

## 2019-05-04 DIAGNOSIS — E119 Type 2 diabetes mellitus without complications: Secondary | ICD-10-CM

## 2019-05-04 MED ORDER — GLUCOSE BLOOD VI STRP: ORAL_STRIP | 3 refills | Status: AC

## 2019-05-04 MED ORDER — CHLORTHALIDONE 25 MG PO TABS: 12.5000 mg | ORAL_TABLET | Freq: Every day | ORAL | 0 refills | Status: AC

## 2019-05-11 ENCOUNTER — Telehealth: Payer: Self-pay | Admitting: Internal Medicine

## 2019-05-11 ENCOUNTER — Other Ambulatory Visit: Payer: Self-pay

## 2019-05-11 DIAGNOSIS — E1165 Type 2 diabetes mellitus with hyperglycemia: Secondary | ICD-10-CM

## 2019-05-11 MED ORDER — TRULICITY 0.75 MG/0.5ML ~~LOC~~ SOAJ: 0.7500 mg | SUBCUTANEOUS | 2 refills | Status: AC

## 2019-05-11 NOTE — Telephone Encounter (Signed)
err

## 2019-05-15 DIAGNOSIS — M6281 Muscle weakness (generalized): Secondary | ICD-10-CM | POA: Diagnosis not present

## 2019-05-15 DIAGNOSIS — M1990 Unspecified osteoarthritis, unspecified site: Secondary | ICD-10-CM | POA: Diagnosis not present

## 2019-05-15 DIAGNOSIS — M25651 Stiffness of right hip, not elsewhere classified: Secondary | ICD-10-CM | POA: Diagnosis not present

## 2019-05-15 DIAGNOSIS — Z7409 Other reduced mobility: Secondary | ICD-10-CM | POA: Diagnosis not present

## 2019-05-15 DIAGNOSIS — M25561 Pain in right knee: Secondary | ICD-10-CM | POA: Diagnosis not present

## 2019-05-15 DIAGNOSIS — M25559 Pain in unspecified hip: Secondary | ICD-10-CM | POA: Diagnosis not present

## 2019-05-16 ENCOUNTER — Ambulatory Visit: Payer: Medicare Other | Admitting: Internal Medicine

## 2019-05-17 DIAGNOSIS — M150 Primary generalized (osteo)arthritis: Secondary | ICD-10-CM | POA: Diagnosis not present

## 2019-05-17 DIAGNOSIS — M1611 Unilateral primary osteoarthritis, right hip: Secondary | ICD-10-CM | POA: Diagnosis not present

## 2019-05-18 DIAGNOSIS — F419 Anxiety disorder, unspecified: Secondary | ICD-10-CM | POA: Diagnosis not present

## 2019-05-18 DIAGNOSIS — F5101 Primary insomnia: Secondary | ICD-10-CM | POA: Diagnosis not present

## 2019-06-04 ENCOUNTER — Ambulatory Visit: Payer: Medicare Other | Admitting: Podiatry

## 2019-06-07 DIAGNOSIS — M25551 Pain in right hip: Secondary | ICD-10-CM | POA: Diagnosis not present

## 2019-06-22 DIAGNOSIS — Z01812 Encounter for preprocedural laboratory examination: Secondary | ICD-10-CM | POA: Diagnosis not present

## 2019-06-22 DIAGNOSIS — E119 Type 2 diabetes mellitus without complications: Secondary | ICD-10-CM | POA: Diagnosis not present

## 2019-06-22 DIAGNOSIS — K760 Fatty (change of) liver, not elsewhere classified: Secondary | ICD-10-CM | POA: Diagnosis not present

## 2019-06-22 DIAGNOSIS — Z01818 Encounter for other preprocedural examination: Secondary | ICD-10-CM | POA: Diagnosis not present

## 2019-06-22 DIAGNOSIS — Z01810 Encounter for preprocedural cardiovascular examination: Secondary | ICD-10-CM | POA: Diagnosis not present

## 2019-06-22 DIAGNOSIS — Z6841 Body Mass Index (BMI) 40.0 and over, adult: Secondary | ICD-10-CM | POA: Diagnosis not present

## 2019-06-22 DIAGNOSIS — K219 Gastro-esophageal reflux disease without esophagitis: Secondary | ICD-10-CM | POA: Diagnosis not present

## 2019-06-22 DIAGNOSIS — Z794 Long term (current) use of insulin: Secondary | ICD-10-CM | POA: Diagnosis not present

## 2019-06-22 DIAGNOSIS — M1611 Unilateral primary osteoarthritis, right hip: Secondary | ICD-10-CM | POA: Diagnosis not present

## 2019-06-22 DIAGNOSIS — I10 Essential (primary) hypertension: Secondary | ICD-10-CM | POA: Diagnosis not present

## 2019-07-02 DIAGNOSIS — Z01818 Encounter for other preprocedural examination: Secondary | ICD-10-CM | POA: Diagnosis not present

## 2019-07-06 DIAGNOSIS — Z7982 Long term (current) use of aspirin: Secondary | ICD-10-CM | POA: Diagnosis not present

## 2019-07-06 DIAGNOSIS — Z8719 Personal history of other diseases of the digestive system: Secondary | ICD-10-CM | POA: Diagnosis not present

## 2019-07-06 DIAGNOSIS — Z888 Allergy status to other drugs, medicaments and biological substances status: Secondary | ICD-10-CM | POA: Diagnosis not present

## 2019-07-06 DIAGNOSIS — Z471 Aftercare following joint replacement surgery: Secondary | ICD-10-CM | POA: Diagnosis not present

## 2019-07-06 DIAGNOSIS — I10 Essential (primary) hypertension: Secondary | ICD-10-CM | POA: Diagnosis not present

## 2019-07-06 DIAGNOSIS — Z809 Family history of malignant neoplasm, unspecified: Secondary | ICD-10-CM | POA: Diagnosis not present

## 2019-07-06 DIAGNOSIS — Z79899 Other long term (current) drug therapy: Secondary | ICD-10-CM | POA: Diagnosis not present

## 2019-07-06 DIAGNOSIS — F419 Anxiety disorder, unspecified: Secondary | ICD-10-CM | POA: Diagnosis not present

## 2019-07-06 DIAGNOSIS — M1611 Unilateral primary osteoarthritis, right hip: Secondary | ICD-10-CM | POA: Diagnosis not present

## 2019-07-06 DIAGNOSIS — Z6841 Body Mass Index (BMI) 40.0 and over, adult: Secondary | ICD-10-CM | POA: Diagnosis not present

## 2019-07-06 DIAGNOSIS — Z882 Allergy status to sulfonamides status: Secondary | ICD-10-CM | POA: Diagnosis not present

## 2019-07-06 DIAGNOSIS — Z794 Long term (current) use of insulin: Secondary | ICD-10-CM | POA: Diagnosis not present

## 2019-07-06 DIAGNOSIS — Z96643 Presence of artificial hip joint, bilateral: Secondary | ICD-10-CM | POA: Diagnosis not present

## 2019-07-06 DIAGNOSIS — K219 Gastro-esophageal reflux disease without esophagitis: Secondary | ICD-10-CM | POA: Diagnosis not present

## 2019-07-06 DIAGNOSIS — F329 Major depressive disorder, single episode, unspecified: Secondary | ICD-10-CM | POA: Diagnosis not present

## 2019-07-06 DIAGNOSIS — E119 Type 2 diabetes mellitus without complications: Secondary | ICD-10-CM | POA: Diagnosis not present

## 2019-07-09 DIAGNOSIS — Z7982 Long term (current) use of aspirin: Secondary | ICD-10-CM | POA: Diagnosis not present

## 2019-07-09 DIAGNOSIS — Z9181 History of falling: Secondary | ICD-10-CM | POA: Diagnosis not present

## 2019-07-09 DIAGNOSIS — Z471 Aftercare following joint replacement surgery: Secondary | ICD-10-CM | POA: Diagnosis not present

## 2019-07-09 DIAGNOSIS — Z602 Problems related to living alone: Secondary | ICD-10-CM | POA: Diagnosis not present

## 2019-07-09 DIAGNOSIS — Z79899 Other long term (current) drug therapy: Secondary | ICD-10-CM | POA: Diagnosis not present

## 2019-07-09 DIAGNOSIS — Z7951 Long term (current) use of inhaled steroids: Secondary | ICD-10-CM | POA: Diagnosis not present

## 2019-07-09 DIAGNOSIS — E119 Type 2 diabetes mellitus without complications: Secondary | ICD-10-CM | POA: Diagnosis not present

## 2019-07-09 DIAGNOSIS — Z96641 Presence of right artificial hip joint: Secondary | ICD-10-CM | POA: Diagnosis not present

## 2019-07-09 DIAGNOSIS — I10 Essential (primary) hypertension: Secondary | ICD-10-CM | POA: Diagnosis not present

## 2019-07-09 DIAGNOSIS — F329 Major depressive disorder, single episode, unspecified: Secondary | ICD-10-CM | POA: Diagnosis not present

## 2019-07-09 DIAGNOSIS — M1990 Unspecified osteoarthritis, unspecified site: Secondary | ICD-10-CM | POA: Diagnosis not present

## 2019-07-09 DIAGNOSIS — F419 Anxiety disorder, unspecified: Secondary | ICD-10-CM | POA: Diagnosis not present

## 2019-07-09 DIAGNOSIS — K219 Gastro-esophageal reflux disease without esophagitis: Secondary | ICD-10-CM | POA: Diagnosis not present

## 2019-07-11 DIAGNOSIS — E119 Type 2 diabetes mellitus without complications: Secondary | ICD-10-CM | POA: Diagnosis not present

## 2019-07-11 DIAGNOSIS — F329 Major depressive disorder, single episode, unspecified: Secondary | ICD-10-CM | POA: Diagnosis not present

## 2019-07-11 DIAGNOSIS — Z471 Aftercare following joint replacement surgery: Secondary | ICD-10-CM | POA: Diagnosis not present

## 2019-07-11 DIAGNOSIS — Z96641 Presence of right artificial hip joint: Secondary | ICD-10-CM | POA: Diagnosis not present

## 2019-07-11 DIAGNOSIS — I10 Essential (primary) hypertension: Secondary | ICD-10-CM | POA: Diagnosis not present

## 2019-07-11 DIAGNOSIS — M1990 Unspecified osteoarthritis, unspecified site: Secondary | ICD-10-CM | POA: Diagnosis not present

## 2019-07-12 DIAGNOSIS — I10 Essential (primary) hypertension: Secondary | ICD-10-CM | POA: Diagnosis not present

## 2019-07-12 DIAGNOSIS — Z471 Aftercare following joint replacement surgery: Secondary | ICD-10-CM | POA: Diagnosis not present

## 2019-07-12 DIAGNOSIS — F329 Major depressive disorder, single episode, unspecified: Secondary | ICD-10-CM | POA: Diagnosis not present

## 2019-07-12 DIAGNOSIS — E119 Type 2 diabetes mellitus without complications: Secondary | ICD-10-CM | POA: Diagnosis not present

## 2019-07-12 DIAGNOSIS — Z96641 Presence of right artificial hip joint: Secondary | ICD-10-CM | POA: Diagnosis not present

## 2019-07-12 DIAGNOSIS — M1990 Unspecified osteoarthritis, unspecified site: Secondary | ICD-10-CM | POA: Diagnosis not present

## 2019-07-13 ENCOUNTER — Ambulatory Visit: Payer: Medicare Other | Admitting: Internal Medicine

## 2019-07-13 ENCOUNTER — Ambulatory Visit: Payer: Medicare Other

## 2019-07-13 DIAGNOSIS — M1990 Unspecified osteoarthritis, unspecified site: Secondary | ICD-10-CM | POA: Diagnosis not present

## 2019-07-13 DIAGNOSIS — E119 Type 2 diabetes mellitus without complications: Secondary | ICD-10-CM | POA: Diagnosis not present

## 2019-07-13 DIAGNOSIS — I10 Essential (primary) hypertension: Secondary | ICD-10-CM | POA: Diagnosis not present

## 2019-07-13 DIAGNOSIS — F329 Major depressive disorder, single episode, unspecified: Secondary | ICD-10-CM | POA: Diagnosis not present

## 2019-07-13 DIAGNOSIS — Z96641 Presence of right artificial hip joint: Secondary | ICD-10-CM | POA: Diagnosis not present

## 2019-07-13 DIAGNOSIS — Z471 Aftercare following joint replacement surgery: Secondary | ICD-10-CM | POA: Diagnosis not present

## 2019-07-16 DIAGNOSIS — E119 Type 2 diabetes mellitus without complications: Secondary | ICD-10-CM | POA: Diagnosis not present

## 2019-07-16 DIAGNOSIS — Z471 Aftercare following joint replacement surgery: Secondary | ICD-10-CM | POA: Diagnosis not present

## 2019-07-16 DIAGNOSIS — Z96641 Presence of right artificial hip joint: Secondary | ICD-10-CM | POA: Diagnosis not present

## 2019-07-16 DIAGNOSIS — F329 Major depressive disorder, single episode, unspecified: Secondary | ICD-10-CM | POA: Diagnosis not present

## 2019-07-16 DIAGNOSIS — M1990 Unspecified osteoarthritis, unspecified site: Secondary | ICD-10-CM | POA: Diagnosis not present

## 2019-07-16 DIAGNOSIS — I10 Essential (primary) hypertension: Secondary | ICD-10-CM | POA: Diagnosis not present

## 2019-07-18 DIAGNOSIS — M1990 Unspecified osteoarthritis, unspecified site: Secondary | ICD-10-CM | POA: Diagnosis not present

## 2019-07-18 DIAGNOSIS — F329 Major depressive disorder, single episode, unspecified: Secondary | ICD-10-CM | POA: Diagnosis not present

## 2019-07-18 DIAGNOSIS — E119 Type 2 diabetes mellitus without complications: Secondary | ICD-10-CM | POA: Diagnosis not present

## 2019-07-18 DIAGNOSIS — I10 Essential (primary) hypertension: Secondary | ICD-10-CM | POA: Diagnosis not present

## 2019-07-18 DIAGNOSIS — Z471 Aftercare following joint replacement surgery: Secondary | ICD-10-CM | POA: Diagnosis not present

## 2019-07-18 DIAGNOSIS — Z96641 Presence of right artificial hip joint: Secondary | ICD-10-CM | POA: Diagnosis not present

## 2019-07-19 DIAGNOSIS — M1990 Unspecified osteoarthritis, unspecified site: Secondary | ICD-10-CM | POA: Diagnosis not present

## 2019-07-19 DIAGNOSIS — Z471 Aftercare following joint replacement surgery: Secondary | ICD-10-CM | POA: Diagnosis not present

## 2019-07-19 DIAGNOSIS — I10 Essential (primary) hypertension: Secondary | ICD-10-CM | POA: Diagnosis not present

## 2019-07-19 DIAGNOSIS — F329 Major depressive disorder, single episode, unspecified: Secondary | ICD-10-CM | POA: Diagnosis not present

## 2019-07-19 DIAGNOSIS — Z96641 Presence of right artificial hip joint: Secondary | ICD-10-CM | POA: Diagnosis not present

## 2019-07-19 DIAGNOSIS — E119 Type 2 diabetes mellitus without complications: Secondary | ICD-10-CM | POA: Diagnosis not present

## 2019-07-23 DIAGNOSIS — B351 Tinea unguium: Secondary | ICD-10-CM | POA: Diagnosis not present

## 2019-07-23 DIAGNOSIS — M79675 Pain in left toe(s): Secondary | ICD-10-CM | POA: Diagnosis not present

## 2019-07-23 DIAGNOSIS — M79674 Pain in right toe(s): Secondary | ICD-10-CM | POA: Diagnosis not present

## 2019-08-01 DIAGNOSIS — M25551 Pain in right hip: Secondary | ICD-10-CM | POA: Diagnosis not present

## 2019-08-20 DIAGNOSIS — F5101 Primary insomnia: Secondary | ICD-10-CM | POA: Diagnosis not present

## 2019-08-20 DIAGNOSIS — H9201 Otalgia, right ear: Secondary | ICD-10-CM | POA: Diagnosis not present

## 2019-10-16 DIAGNOSIS — Z780 Asymptomatic menopausal state: Secondary | ICD-10-CM | POA: Diagnosis not present

## 2019-10-16 DIAGNOSIS — E0800 Diabetes mellitus due to underlying condition with hyperosmolarity without nonketotic hyperglycemic-hyperosmolar coma (NKHHC): Secondary | ICD-10-CM | POA: Diagnosis not present

## 2019-10-16 DIAGNOSIS — F5101 Primary insomnia: Secondary | ICD-10-CM | POA: Diagnosis not present

## 2019-10-16 DIAGNOSIS — Z0000 Encounter for general adult medical examination without abnormal findings: Secondary | ICD-10-CM | POA: Diagnosis not present

## 2019-10-22 DIAGNOSIS — B351 Tinea unguium: Secondary | ICD-10-CM | POA: Diagnosis not present

## 2019-10-22 DIAGNOSIS — M79675 Pain in left toe(s): Secondary | ICD-10-CM | POA: Diagnosis not present

## 2019-10-22 DIAGNOSIS — M79674 Pain in right toe(s): Secondary | ICD-10-CM | POA: Diagnosis not present

## 2019-11-06 DIAGNOSIS — Z780 Asymptomatic menopausal state: Secondary | ICD-10-CM | POA: Diagnosis not present

## 2019-11-20 DIAGNOSIS — Z6841 Body Mass Index (BMI) 40.0 and over, adult: Secondary | ICD-10-CM | POA: Diagnosis not present

## 2019-11-20 DIAGNOSIS — F5104 Psychophysiologic insomnia: Secondary | ICD-10-CM | POA: Diagnosis not present

## 2019-11-27 DIAGNOSIS — R232 Flushing: Secondary | ICD-10-CM | POA: Diagnosis not present

## 2019-11-27 DIAGNOSIS — E7800 Pure hypercholesterolemia, unspecified: Secondary | ICD-10-CM | POA: Diagnosis not present

## 2019-11-27 DIAGNOSIS — N951 Menopausal and female climacteric states: Secondary | ICD-10-CM | POA: Diagnosis not present

## 2019-11-27 DIAGNOSIS — M2559 Pain in other specified joint: Secondary | ICD-10-CM | POA: Diagnosis not present

## 2019-11-27 DIAGNOSIS — F5101 Primary insomnia: Secondary | ICD-10-CM | POA: Diagnosis not present

## 2019-11-27 DIAGNOSIS — Z6841 Body Mass Index (BMI) 40.0 and over, adult: Secondary | ICD-10-CM | POA: Diagnosis not present

## 2019-11-27 DIAGNOSIS — I10 Essential (primary) hypertension: Secondary | ICD-10-CM | POA: Diagnosis not present

## 2019-11-27 DIAGNOSIS — E119 Type 2 diabetes mellitus without complications: Secondary | ICD-10-CM | POA: Diagnosis not present

## 2019-12-04 DIAGNOSIS — E119 Type 2 diabetes mellitus without complications: Secondary | ICD-10-CM | POA: Diagnosis not present

## 2019-12-04 DIAGNOSIS — N951 Menopausal and female climacteric states: Secondary | ICD-10-CM | POA: Diagnosis not present

## 2019-12-04 DIAGNOSIS — Z1331 Encounter for screening for depression: Secondary | ICD-10-CM | POA: Diagnosis not present

## 2019-12-04 DIAGNOSIS — M2559 Pain in other specified joint: Secondary | ICD-10-CM | POA: Diagnosis not present

## 2019-12-04 DIAGNOSIS — I10 Essential (primary) hypertension: Secondary | ICD-10-CM | POA: Diagnosis not present

## 2019-12-04 DIAGNOSIS — E7800 Pure hypercholesterolemia, unspecified: Secondary | ICD-10-CM | POA: Diagnosis not present

## 2019-12-04 DIAGNOSIS — Z6841 Body Mass Index (BMI) 40.0 and over, adult: Secondary | ICD-10-CM | POA: Diagnosis not present

## 2019-12-04 DIAGNOSIS — Z1339 Encounter for screening examination for other mental health and behavioral disorders: Secondary | ICD-10-CM | POA: Diagnosis not present

## 2019-12-04 DIAGNOSIS — F5101 Primary insomnia: Secondary | ICD-10-CM | POA: Diagnosis not present

## 2019-12-14 DIAGNOSIS — Z6841 Body Mass Index (BMI) 40.0 and over, adult: Secondary | ICD-10-CM | POA: Diagnosis not present

## 2019-12-14 DIAGNOSIS — E119 Type 2 diabetes mellitus without complications: Secondary | ICD-10-CM | POA: Diagnosis not present

## 2019-12-17 DIAGNOSIS — M25512 Pain in left shoulder: Secondary | ICD-10-CM | POA: Diagnosis not present

## 2020-01-04 DIAGNOSIS — I10 Essential (primary) hypertension: Secondary | ICD-10-CM | POA: Diagnosis not present

## 2020-01-04 DIAGNOSIS — Z6841 Body Mass Index (BMI) 40.0 and over, adult: Secondary | ICD-10-CM | POA: Diagnosis not present

## 2020-01-09 DIAGNOSIS — R21 Rash and other nonspecific skin eruption: Secondary | ICD-10-CM | POA: Diagnosis not present

## 2020-01-18 DIAGNOSIS — Z6841 Body Mass Index (BMI) 40.0 and over, adult: Secondary | ICD-10-CM | POA: Diagnosis not present

## 2020-01-18 DIAGNOSIS — E7800 Pure hypercholesterolemia, unspecified: Secondary | ICD-10-CM | POA: Diagnosis not present

## 2020-01-18 DIAGNOSIS — I10 Essential (primary) hypertension: Secondary | ICD-10-CM | POA: Diagnosis not present

## 2020-01-19 DIAGNOSIS — H259 Unspecified age-related cataract: Secondary | ICD-10-CM | POA: Diagnosis not present

## 2020-01-19 DIAGNOSIS — H524 Presbyopia: Secondary | ICD-10-CM | POA: Diagnosis not present

## 2020-01-19 DIAGNOSIS — E119 Type 2 diabetes mellitus without complications: Secondary | ICD-10-CM | POA: Diagnosis not present

## 2020-02-01 DIAGNOSIS — E119 Type 2 diabetes mellitus without complications: Secondary | ICD-10-CM | POA: Diagnosis not present

## 2020-02-01 DIAGNOSIS — Z6841 Body Mass Index (BMI) 40.0 and over, adult: Secondary | ICD-10-CM | POA: Diagnosis not present

## 2020-02-15 DIAGNOSIS — I10 Essential (primary) hypertension: Secondary | ICD-10-CM | POA: Diagnosis not present

## 2020-02-15 DIAGNOSIS — Z6841 Body Mass Index (BMI) 40.0 and over, adult: Secondary | ICD-10-CM | POA: Diagnosis not present

## 2020-02-15 DIAGNOSIS — E119 Type 2 diabetes mellitus without complications: Secondary | ICD-10-CM | POA: Diagnosis not present

## 2020-02-20 DIAGNOSIS — L819 Disorder of pigmentation, unspecified: Secondary | ICD-10-CM | POA: Diagnosis not present

## 2020-02-20 DIAGNOSIS — L209 Atopic dermatitis, unspecified: Secondary | ICD-10-CM | POA: Diagnosis not present

## 2020-03-27 DIAGNOSIS — R42 Dizziness and giddiness: Secondary | ICD-10-CM | POA: Diagnosis not present

## 2020-04-01 DIAGNOSIS — N3000 Acute cystitis without hematuria: Secondary | ICD-10-CM | POA: Diagnosis not present

## 2020-04-09 DIAGNOSIS — K8020 Calculus of gallbladder without cholecystitis without obstruction: Secondary | ICD-10-CM | POA: Diagnosis not present

## 2020-04-21 DIAGNOSIS — I10 Essential (primary) hypertension: Secondary | ICD-10-CM | POA: Diagnosis not present

## 2020-05-02 DIAGNOSIS — K8020 Calculus of gallbladder without cholecystitis without obstruction: Secondary | ICD-10-CM | POA: Diagnosis not present

## 2020-07-26 DIAGNOSIS — Z23 Encounter for immunization: Secondary | ICD-10-CM | POA: Diagnosis not present

## 2020-09-11 DIAGNOSIS — M1712 Unilateral primary osteoarthritis, left knee: Secondary | ICD-10-CM | POA: Diagnosis not present

## 2020-09-11 DIAGNOSIS — M25562 Pain in left knee: Secondary | ICD-10-CM | POA: Diagnosis not present

## 2020-09-11 DIAGNOSIS — Z96641 Presence of right artificial hip joint: Secondary | ICD-10-CM | POA: Diagnosis not present

## 2020-09-11 DIAGNOSIS — M25551 Pain in right hip: Secondary | ICD-10-CM | POA: Diagnosis not present

## 2020-09-24 DIAGNOSIS — Z1152 Encounter for screening for COVID-19: Secondary | ICD-10-CM | POA: Diagnosis not present

## 2020-09-24 DIAGNOSIS — Z1159 Encounter for screening for other viral diseases: Secondary | ICD-10-CM | POA: Diagnosis not present

## 2021-05-07 IMAGING — US ULTRASOUND ABDOMEN COMPLETE
1 series · 14 of 25 positions shown · non-contrast
Comparison: None.

CLINICAL DATA: Hyperglycemia.

EXAM:
ABDOMEN ULTRASOUND COMPLETE

[Series 1: ultrasound abdomen complete · 0.25mm/px · 14 of 70 slices shown]
[im 1/70]
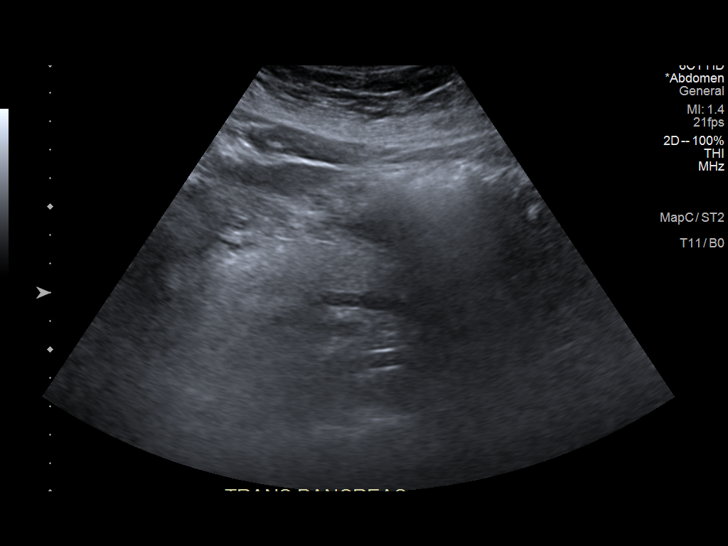
[im 6/70]
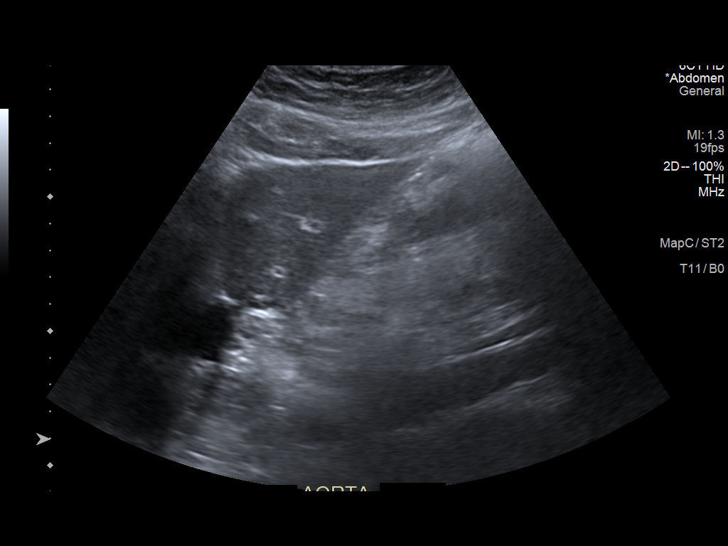
[im 12/70]
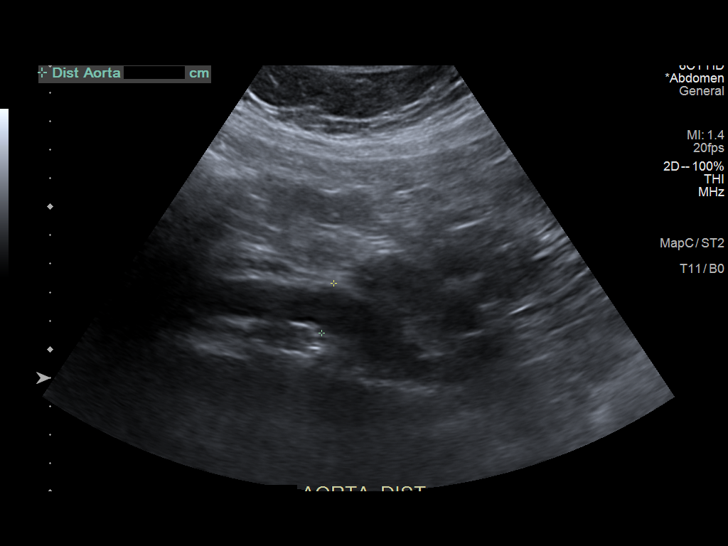
[im 18/70]
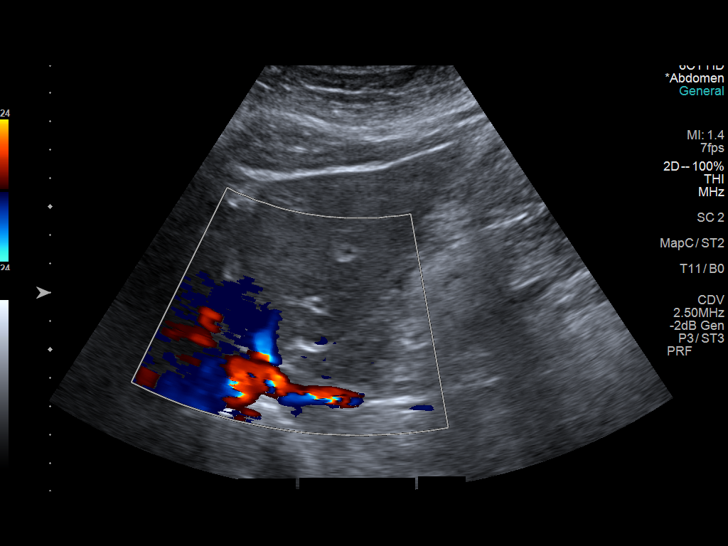
[im 24/70]
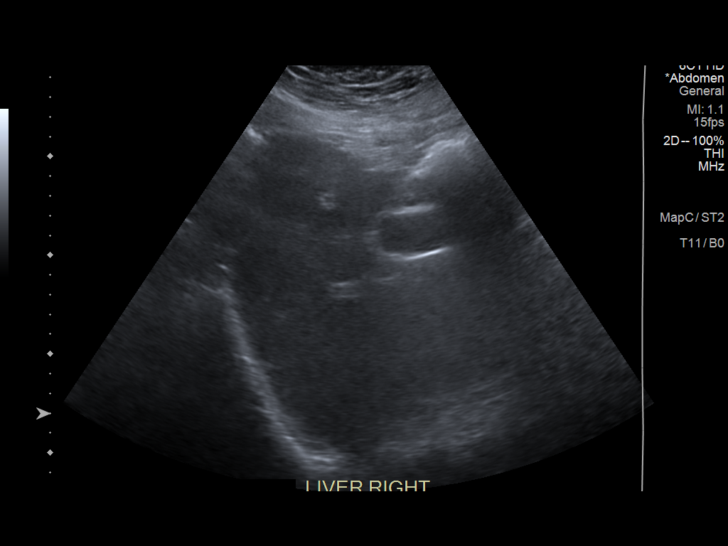
[im 26/70]
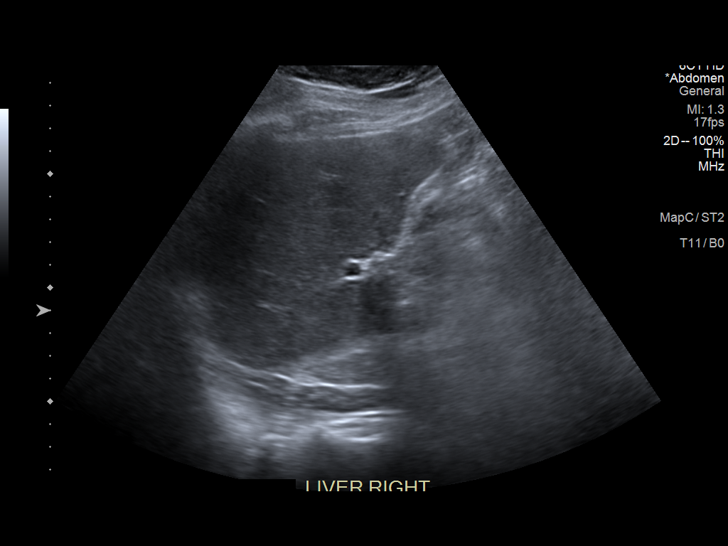
[im 32/70]
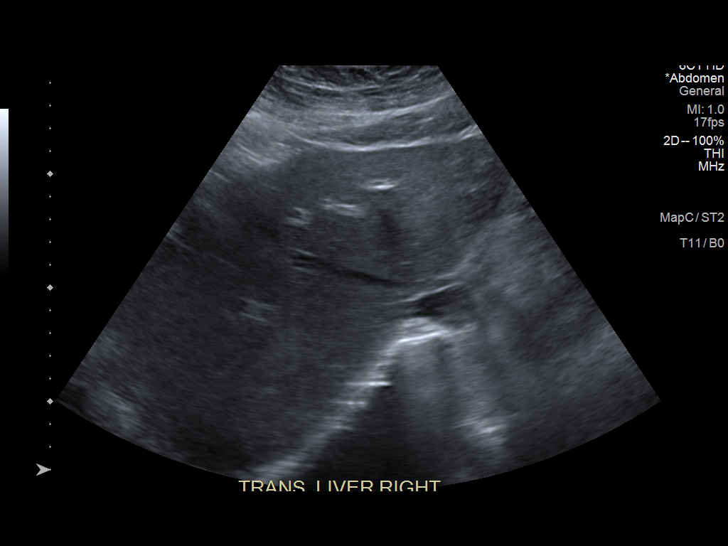
[im 38/70]
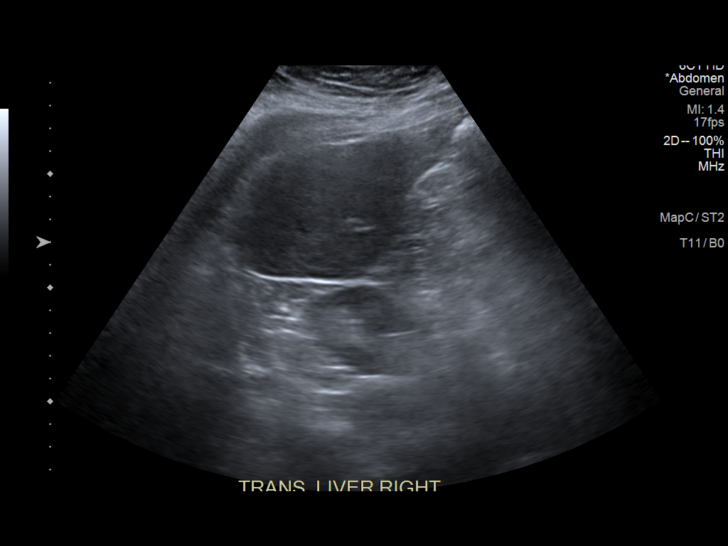
[im 44/70]
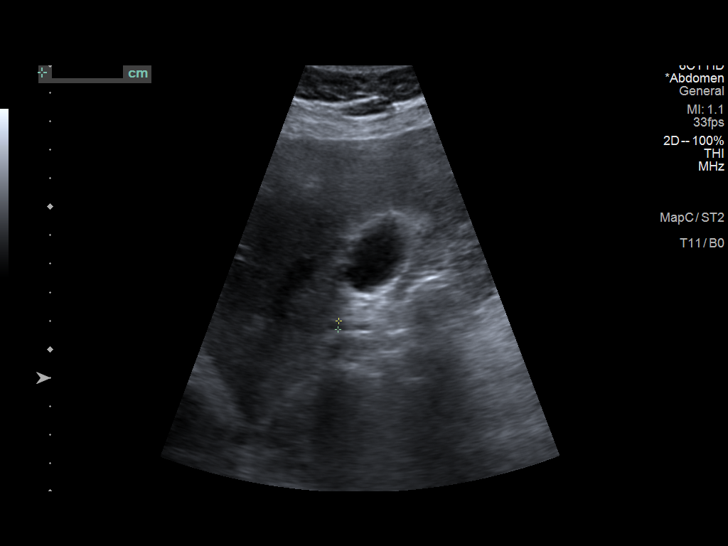
[im 47/70]
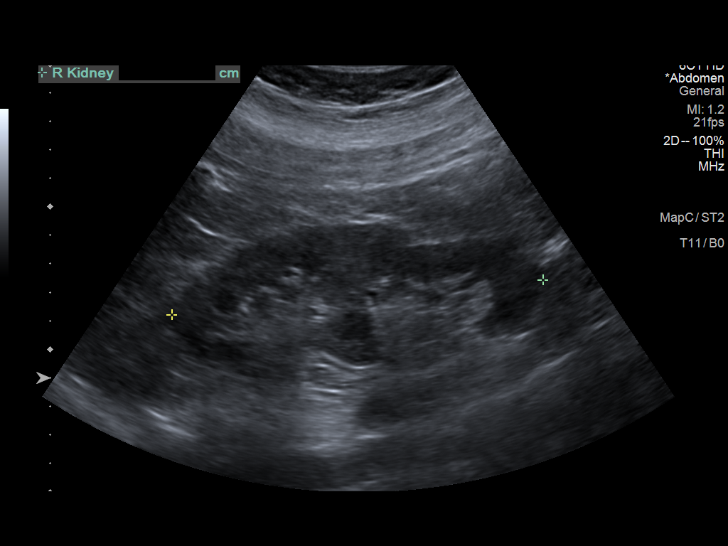
[im 52/70]
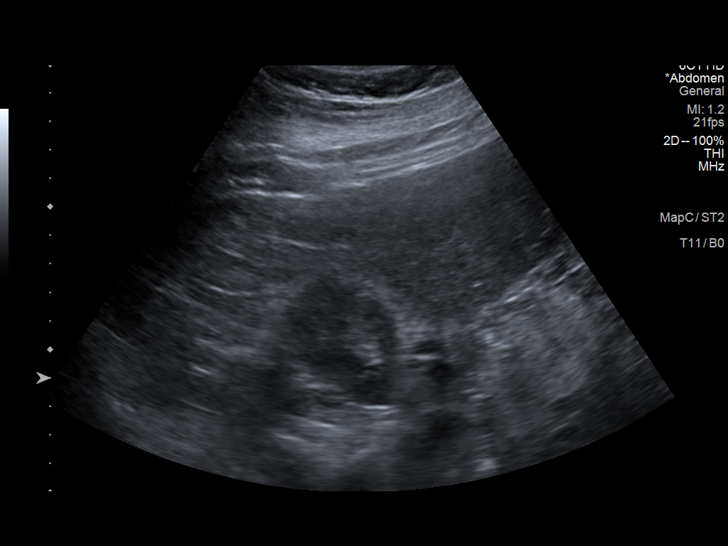
[im 58/70]
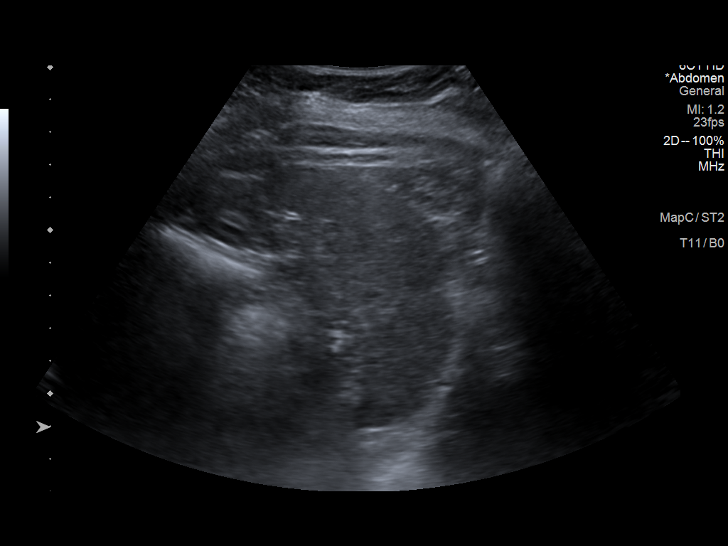
[im 64/70]
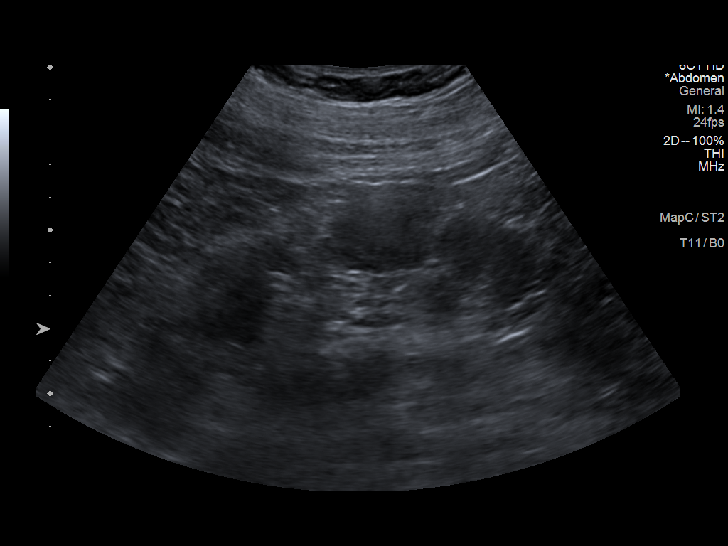
[im 70/70]
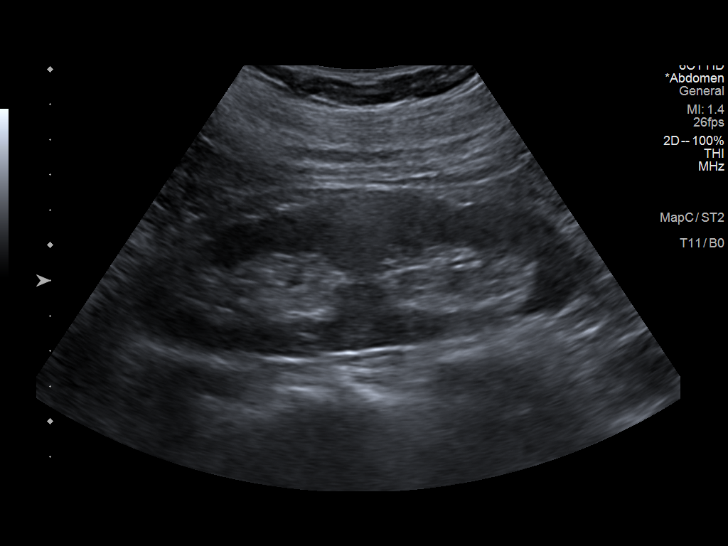

[14 of 25 positions shown; findings below may reference images not displayed]

FINDINGS: Gallbladder: No gallstones or wall thickening visualized. No
sonographic Murphy sign noted by sonographer.

Common bile duct: Diameter: 3 mm which is within normal limits.

Liver: No focal lesion identified. Within normal limits in
parenchymal echogenicity. Portal vein is patent on color Doppler
imaging with normal direction of blood flow towards the liver.

IVC: No abnormality visualized.

Pancreas: Visualized portion unremarkable.

Spleen: Size and appearance within normal limits.

Right Kidney: Length: 13.1 cm. Echogenicity within normal limits. No
mass or hydronephrosis visualized.

Left Kidney: Length: 12.2 cm. Echogenicity within normal limits. No
mass or hydronephrosis visualized.

Abdominal aorta: No aneurysm visualized.

Other findings: None.
IMPRESSION: No definite abnormality seen.
